# Patient Record
Sex: Male | Born: 1980 | Race: White | Hispanic: No | Marital: Married | Smoking: Current every day smoker
Health system: Southern US, Community
[De-identification: ages and names within clinical notes are randomized; demographics above are authoritative.]

## PROBLEM LIST (undated history)

## (undated) DIAGNOSIS — Z5900 Homelessness unspecified: Secondary | ICD-10-CM

## (undated) DIAGNOSIS — F319 Bipolar disorder, unspecified: Secondary | ICD-10-CM

## (undated) DIAGNOSIS — F419 Anxiety disorder, unspecified: Secondary | ICD-10-CM

---

## 2008-07-12 ENCOUNTER — Emergency Department (HOSPITAL_COMMUNITY): Admission: EM | Admit: 2008-07-12 | Discharge: 2008-07-12 | Payer: Self-pay | Admitting: Family Medicine

## 2008-07-25 ENCOUNTER — Ambulatory Visit: Payer: Self-pay | Admitting: *Deleted

## 2008-07-25 DIAGNOSIS — J029 Acute pharyngitis, unspecified: Secondary | ICD-10-CM | POA: Insufficient documentation

## 2008-07-25 DIAGNOSIS — J069 Acute upper respiratory infection, unspecified: Secondary | ICD-10-CM | POA: Insufficient documentation

## 2008-07-26 ENCOUNTER — Ambulatory Visit: Payer: Self-pay | Admitting: *Deleted

## 2008-07-27 ENCOUNTER — Encounter (INDEPENDENT_AMBULATORY_CARE_PROVIDER_SITE_OTHER): Payer: Self-pay | Admitting: *Deleted

## 2008-07-31 ENCOUNTER — Telehealth (INDEPENDENT_AMBULATORY_CARE_PROVIDER_SITE_OTHER): Payer: Self-pay | Admitting: *Deleted

## 2008-09-14 ENCOUNTER — Ambulatory Visit: Payer: Self-pay | Admitting: Psychiatry

## 2008-09-14 ENCOUNTER — Emergency Department (HOSPITAL_BASED_OUTPATIENT_CLINIC_OR_DEPARTMENT_OTHER): Admission: EM | Admit: 2008-09-14 | Discharge: 2008-09-14 | Payer: Self-pay | Admitting: Emergency Medicine

## 2008-09-14 ENCOUNTER — Inpatient Hospital Stay (HOSPITAL_COMMUNITY): Admission: AD | Admit: 2008-09-14 | Discharge: 2008-09-29 | Payer: Self-pay | Admitting: Psychiatry

## 2008-10-17 ENCOUNTER — Emergency Department (HOSPITAL_BASED_OUTPATIENT_CLINIC_OR_DEPARTMENT_OTHER): Admission: EM | Admit: 2008-10-17 | Discharge: 2008-10-17 | Payer: Self-pay | Admitting: Emergency Medicine

## 2008-12-04 ENCOUNTER — Ambulatory Visit (HOSPITAL_COMMUNITY): Payer: Self-pay | Admitting: Psychiatry

## 2008-12-14 ENCOUNTER — Ambulatory Visit (HOSPITAL_COMMUNITY): Payer: Self-pay | Admitting: Psychiatry

## 2009-01-05 ENCOUNTER — Other Ambulatory Visit: Payer: Self-pay | Admitting: Emergency Medicine

## 2009-01-05 ENCOUNTER — Emergency Department (HOSPITAL_COMMUNITY): Admission: EM | Admit: 2009-01-05 | Discharge: 2009-01-05 | Payer: Self-pay | Admitting: Emergency Medicine

## 2009-01-09 ENCOUNTER — Emergency Department (HOSPITAL_BASED_OUTPATIENT_CLINIC_OR_DEPARTMENT_OTHER): Admission: EM | Admit: 2009-01-09 | Discharge: 2009-01-09 | Payer: Self-pay | Admitting: Emergency Medicine

## 2009-04-25 ENCOUNTER — Emergency Department (HOSPITAL_BASED_OUTPATIENT_CLINIC_OR_DEPARTMENT_OTHER): Admission: EM | Admit: 2009-04-25 | Discharge: 2009-04-25 | Payer: Self-pay | Admitting: Emergency Medicine

## 2009-04-27 ENCOUNTER — Emergency Department (HOSPITAL_BASED_OUTPATIENT_CLINIC_OR_DEPARTMENT_OTHER): Admission: EM | Admit: 2009-04-27 | Discharge: 2009-04-27 | Payer: Self-pay | Admitting: Emergency Medicine

## 2009-05-05 ENCOUNTER — Emergency Department (HOSPITAL_BASED_OUTPATIENT_CLINIC_OR_DEPARTMENT_OTHER): Admission: EM | Admit: 2009-05-05 | Discharge: 2009-05-05 | Payer: Self-pay | Admitting: Emergency Medicine

## 2009-12-24 ENCOUNTER — Emergency Department (HOSPITAL_BASED_OUTPATIENT_CLINIC_OR_DEPARTMENT_OTHER): Admission: EM | Admit: 2009-12-24 | Discharge: 2009-12-24 | Payer: Self-pay | Admitting: Emergency Medicine

## 2009-12-24 ENCOUNTER — Ambulatory Visit: Payer: Self-pay | Admitting: Diagnostic Radiology

## 2010-08-08 LAB — POCT TOXICOLOGY PANEL

## 2010-08-08 LAB — COMPREHENSIVE METABOLIC PANEL
Albumin: 4 g/dL (ref 3.5–5.2)
Chloride: 102 mEq/L (ref 96–112)
GFR calc Af Amer: >60 mL/min (ref >60)
GFR calc non Af Amer: >60 mL/min (ref >60)
Potassium: 4.6 mEq/L (ref 3.5–5.1)
Sodium: 139 mEq/L (ref 135–145)

## 2010-08-08 LAB — DIFFERENTIAL
Basophils Absolute: 0.1 10*3/uL (ref 0.0–0.1)
Basophils Relative: 1 % (ref 0–1)
Eosinophils Absolute: 0 10*3/uL (ref 0.0–0.7)
Eosinophils Relative: 1 % (ref 0–5)
Monocytes Relative: 11 % (ref 3–12)
Neutro Abs: 5.2 10*3/uL (ref 1.7–7.7)
Neutrophils Relative %: 56 % (ref 43–77)

## 2010-08-08 LAB — ETHANOL: Alcohol, Ethyl (B): <5 mg/dL (ref 0–10)

## 2010-08-08 LAB — CBC: MCV: 82.5 fL (ref 78.0–100.0)

## 2010-08-12 LAB — BASIC METABOLIC PANEL
Chloride: 100 mEq/L (ref 96–112)
Creatinine, Ser: 0.8 mg/dL (ref 0.4–1.5)
GFR calc non Af Amer: >60 mL/min (ref >60)
Glucose, Bld: 105 mg/dL — ABNORMAL HIGH (ref 70–99)
Potassium: 3.8 mEq/L (ref 3.5–5.1)
Sodium: 138 mEq/L (ref 135–145)

## 2010-08-12 LAB — VALPROIC ACID LEVEL
Valproic Acid Lvl: 46 ug/mL — ABNORMAL LOW (ref 50.0–100.0)
Valproic Acid Lvl: 79.3 ug/mL (ref 50.0–100.0)
Valproic Acid Lvl: 38.7 ug/mL — ABNORMAL LOW (ref 50.0–100.0)

## 2010-08-12 LAB — CBC
Hemoglobin: 13.7 g/dL (ref 13.0–17.0)
MCV: 79.5 fL (ref 78.0–100.0)
RBC: 5.18 MIL/uL (ref 4.22–5.81)

## 2010-08-12 LAB — DIFFERENTIAL
Basophils Relative: 1 % (ref 0–1)
Monocytes Relative: 6 % (ref 3–12)

## 2010-08-12 LAB — POCT TOXICOLOGY PANEL

## 2010-09-16 NOTE — H&P (Signed)
NAMEBROWN, DUNLAP              ACCOUNT NO.:  [id number]   MEDICAL RECORD NO.:  [id number]          PATIENT TYPE:  IPS   LOCATION:  0406                          FACILITY:  [facility]   PHYSICIAN:  Anselm Jungling, MD  DATE OF BIRTH:  Dec 09, 1980   DATE OF ADMISSION:  09/14/2008  DATE OF DISCHARGE:                       PSYCHIATRIC ADMISSION ASSESSMENT   This is an involuntary admission to the services of Dr.  Geralyn Flash.   This is a 30 year old married white male.  The commitment papers  indicate that he is manic, he is not sleeping.  He has been spending  excessive money.  His wife had to lock him out of the house.  He was  found [place] [address] when he was brought to the emergency room.  The patient volunteered only minimal information.  The wife indicated  that he had been increasingly agitated over the past several weeks.  His  grandfather died 2 weeks ago.  He became lost and spent excessive money  on the way home from the funeral, which was in [place], [place].  He  has not been sleeping.  He has been coming and going at odd hours.  She  feels that he is not taking his medications.  He had been out of the  house, and she had not seen him the evening before.  She thinks he  enrolled in college yesterday, which he confirmed, and she found him  walking on [address] and brought him to the hospital.   PAST PSYCHIATRIC HISTORY:  He reports being diagnosed about 6 years ago  as bipolar in [place].  He has only been here in [place] for 6  months and has yet to establish psychiatric care.  He reports that he  received $1008 monthly for his psychiatric disability and that his  daughter and wife each receive $252 each.  He states that he is thinking  about divorcing, as his wife has had many affairs.   FAMILY HISTORY:  There is no other report that any other family member  has bipolar.   ALCOHOL/DRUG HISTORY:  He is not known to be a substance abuser, and he  denies using.   MEDICAL PROBLEMS:  He is a borderline diabetic due to his being  overweight.   MEDICATIONS:  He is supposed to be on Depakote ER 2000 mg p.o. daily and  Invega 6 mg in the morning.  He states that he does not take this.   DRUG ALLERGIES:  No known drug allergies.   POSITIVE PHYSICAL FINDINGS:  He was medically cleared in the ED.  He had  no remarkable physical findings other than a slightly elevated WBC at  14,000.  His glucose was slightly high at 105.  His UDS was completely  negative.  He had no alcohol.  His valproic acid level was low at 38.7.  VITAL SIGNS:  Temperature 98.3, blood pressure 125/66 to 136/99, pulse  110 to 116, respirations 14 to 22.   He did require chemical restraint while in the emergency department of  [facility] and [facility].  This was due to him  feeling that he was being  unjustly committed.  He was rambling.  He was disjointed.  He was  perseverating on details.  He states that his wife told him that the  marriage is done, that she hates him, that he should burn in hell.  He  believes that she only wants his disability check.  He was quite focused  on looking for a lawyer in the phone book to represent him, but he was  easily distracted.   MENTAL STATUS EXAM:  Today, he was seen along with Dr. Katrinka Blazing.  He is  alert and oriented.  He was appropriately groomed, dressed, and  nourished.  His speech was intact, but it was quite superficial.  His  mood was calm.  His affect was flat.  Thought processes were clear,  rational, and goal-oriented.  He was requesting discharge.  Judgment and  insight are poor.  Concentration and memory are poor.  Intelligence is  at average.  He denied being suicidal or homicidal.  He denied having  any auditory or visual hallucinations.   DIAGNOSES:   AXIS I:  Bipolar.   AXIS II:  Deferred.   AXIS III:  No known illnesses but he is obese.   AXIS IV:  Noncompliance with medications.  It is unclear whether he is   getting a divorce or whether he can return home to his wife.   AXIS V:  35.   PLAN:  Admit for safety and stabilization, to reestablish compliance  with medications.  Towards that end, he was given Depakote ER 1000 mg  last night.  He was prescribed Invega 6 mg in the morning, which he  declined.  He was allowed to have Ativan 2 mg p.o. or IM q.6h. p.r.n.  agitation.  He was also allowed to have Ambien 10 mg q.h.s.  Estimated  length of stay is 3 to 5 days.      Mickie Leonarda Salon, P.A.-C.      Anselm Jungling, MD  Electronically Signed    MD/MEDQ  D:  09/15/2008  T:  09/15/2008  Job:  [id number]

## 2010-09-19 NOTE — Discharge Summary (Signed)
Timothy Prince, Timothy Prince              ACCOUNT NO.:  [id number]   MEDICAL RECORD NO.:  [id number]          PATIENT TYPE:  IPS   LOCATION:  0400                          FACILITY:  [facility]   PHYSICIAN:  Anselm Jungling, MD  DATE OF BIRTH:  [birth year]   DATE OF ADMISSION:  09/14/2008  DATE OF DISCHARGE:  09/29/2008                               DISCHARGE SUMMARY   IDENTIFYING INFORMATION/JUSTIFICATION FOR ADMISSION AND CARE:  This was  an inpatient psychiatric admission for Timothy Prince, a 30 year old Caucasian  married male who was admitted due to exacerbation of preexisting bipolar  disorder.  Please refer to the admission note for further details  pertaining to the symptoms, circumstances and history that led to his  hospitalization.  He was given an initial Axis I diagnosis of bipolar  disorder NOS.   MEDICAL AND LABORATORY:  The patient was in good health without any  active or chronic medical problems.  He was medically and physically  assessed by the psychiatric nurse practitioner.  There were no  significant medical issues.   HOSPITAL COURSE:  The patient was admitted to the adult inpatient  psychiatric service.  He presented as a well-nourished, normally-  developed male who appeared to be in a hypomanic state, with  disorganized thinking.  He appeared to have limited insight.  He did not  see a basis for his hospitalization.  He tended to externalize reasons  for his admission, indicating that it had to do with his wife's  problems.   We were later able to learn through his wife that the patient had a  history of severe mental disorder that had previously been treated in  other states, before they moved to the [facility] area.  Apparently,  in 2009, the patient had a severe illness that involved several months  of treatment before it was finally stabilized.  The wife stated that in  the past, Tahjay would become stabilized on medication, would do well,  but then would go off  medication, and become seriously ill again.   The patient was treated with a combination of Abilify, Haldol, and  Depakote.  Over a 2-week period, he stabilized very gradually.  As he  did so, his hypomanic behaviors and thinking gradually diminished, he  became more realistic, and showed more in the way of accurate insight  into his condition.   His wife was available for family conferencing, and was quite supportive  throughout his stay.  Although it was not clear whether this couple  would decide to separate, at the beginning of his stay, by the end of  his hospital stay, he showed enough good compliance with medication  treatment and good enough response to medication treatment that his wife  was encouraged and wanted him to be able to come home.   There was a family session that occurred on the day of discharge, over  the phone.  The patient was able to acknowledge in this telephone  conference that he was much calmer, and that he had been out of control  when he first came into the hospital.  He verbalized that he needed to  stay on his medications and that he actually does feel better when he  was taking.  Wife agreed that the patient had made significant progress.  Discharge and aftercare planning was discussed at length.  The patient  agreed to following aftercare plan.   AFTERCARE:  The patient was to follow up at [facility], with an intake appointment on June 2nd at 11 a.m.   DISCHARGE MEDICATIONS:  1. Abilify 20 mg nightly.  2. Haldol 10 mg b.i.d.  3. Depakote 1000 mg b.i.d.  4. Ambien 10 mg nightly.  5. Ativan 2 mg 1 p.o. p.r.n. severe agitation.   DISCHARGE DIAGNOSES:  Axis I:  Schizoaffective disorder, most recently  manic with psychotic features, resolving.  Axis II:  Deferred.  Axis III:  No acute or chronic illnesses.  Axis IV:  Stressors severe.  Axis V:  Global Assessment of Functioning on discharge 55.      Anselm Jungling, MD   Electronically Signed     SPB/MEDQ  D:  10/01/2008  T:  10/01/2008  Job:  [id number]

## 2011-01-16 DIAGNOSIS — F319 Bipolar disorder, unspecified: Secondary | ICD-10-CM | POA: Insufficient documentation

## 2011-01-16 DIAGNOSIS — X500XXA Overexertion from strenuous movement or load, initial encounter: Secondary | ICD-10-CM | POA: Insufficient documentation

## 2011-01-16 DIAGNOSIS — Y92009 Unspecified place in unspecified non-institutional (private) residence as the place of occurrence of the external cause: Secondary | ICD-10-CM | POA: Insufficient documentation

## 2011-01-16 DIAGNOSIS — F411 Generalized anxiety disorder: Secondary | ICD-10-CM | POA: Insufficient documentation

## 2011-01-16 DIAGNOSIS — IMO0002 Reserved for concepts with insufficient information to code with codable children: Secondary | ICD-10-CM | POA: Insufficient documentation

## 2011-01-17 ENCOUNTER — Other Ambulatory Visit: Payer: Self-pay

## 2011-01-17 ENCOUNTER — Emergency Department (HOSPITAL_BASED_OUTPATIENT_CLINIC_OR_DEPARTMENT_OTHER)
Admission: EM | Admit: 2011-01-17 | Discharge: 2011-01-17 | Disposition: A | Discharge status: Home / Self Care | Payer: Medicare Other | Attending: Emergency Medicine | Admitting: Emergency Medicine

## 2011-01-17 ENCOUNTER — Emergency Department (INDEPENDENT_AMBULATORY_CARE_PROVIDER_SITE_OTHER): Payer: Medicare Other

## 2011-01-17 DIAGNOSIS — S29011A Strain of muscle and tendon of front wall of thorax, initial encounter: Secondary | ICD-10-CM

## 2011-01-17 DIAGNOSIS — R0789 Other chest pain: Secondary | ICD-10-CM

## 2011-01-17 HISTORY — DX: Bipolar disorder, unspecified: F31.9

## 2011-01-17 HISTORY — DX: Anxiety disorder, unspecified: F41.9

## 2011-01-17 LAB — CBC
HCT: 40.8 % (ref 39.0–52.0)
MCH: 26.9 pg (ref 26.0–34.0)
MCV: 80.2 fL (ref 78.0–100.0)
Platelets: 256 10*3/uL (ref 150–400)
RBC: 5.09 MIL/uL (ref 4.22–5.81)
RDW: 13.2 % (ref 11.5–15.5)

## 2011-01-17 LAB — BASIC METABOLIC PANEL
CO2: 25 mEq/L (ref 19–32)
Calcium: 9.1 mg/dL (ref 8.4–10.5)
Creatinine, Ser: 0.5 mg/dL (ref 0.50–1.35)
GFR calc Af Amer: >60 mL/min (ref >60)
GFR calc non Af Amer: >60 mL/min (ref >60)
Glucose, Bld: 108 mg/dL — ABNORMAL HIGH (ref 70–99)
Potassium: 4.2 mEq/L (ref 3.5–5.1)

## 2011-01-17 LAB — CARDIAC PANEL(CRET KIN+CKTOT+MB+TROPI)
CK, MB: 1.5 ng/mL (ref 0.3–4.0)
Relative Index: 1.5 (ref 0.0–2.5)

## 2011-01-17 LAB — D-DIMER, QUANTITATIVE: D-Dimer, Quant: <0.22 ug/mL-FEU (ref 0.00–0.48)

## 2011-01-17 MED ORDER — ASPIRIN 325 MG PO TABS
325.0000 mg | ORAL_TABLET | ORAL | Status: AC
  Administered 2011-01-17: 325 mg via ORAL
  Filled 2011-01-17: qty 1

## 2011-01-17 MED ORDER — IBUPROFEN 800 MG PO TABS: 800.0000 mg | ORAL_TABLET | Freq: Three times a day (TID) | ORAL | Status: AC

## 2011-01-17 NOTE — ED Provider Notes (Signed)
History     CSN: [id number] Arrival date & time: 01/17/2011 12:17 AM   Chief Complaint  Patient presents with  . Chest Pain     (Include location/radiation/quality/duration/timing/severity/associated sxs/prior treatment) Patient is a 30 y.o. male presenting with chest pain.  Chest Pain The chest pain began 1 - 2 hours ago. Duration of episode(s) is 10 minutes. Chest pain occurs constantly. The chest pain is improving. The pain is associated with breathing. At its most intense, the pain is at 8/10. The pain is currently at 0/10. The quality of the pain is described as sharp. The pain does not radiate. Chest pain is worsened by deep breathing. Pertinent negatives for primary symptoms include no fever, no syncope, no shortness of breath, no cough, no wheezing, no abdominal pain, no nausea and no vomiting.  Pertinent negatives for associated symptoms include no diaphoresis, no lower extremity edema and no weakness. He tried nothing for the symptoms. Risk factors include male gender.  Pertinent negatives for past medical history include no aortic dissection, no CAD, no cancer and no PE.  Pertinent negatives for family medical history include: no CAD in family and no Marfan's syndrome in family.  Procedure history is negative for exercise treadmill test.   at home, was carrying a heavy basket with onset of L sided sharp pains, seemed to be worse with a deep breath, now almost gone. No SOB or other associated symptoms   Past Medical History  Diagnosis Date  . Anxiety   . Bipolar disorder      History reviewed. No pertinent past surgical history.  History reviewed. No pertinent family history.  History  Substance Use Topics  . Smoking status: Never Smoker   . Smokeless tobacco: Never Used  . Alcohol Use: No      Review of Systems  Constitutional: Negative for fever, chills and diaphoresis.  HENT: Negative for neck pain and neck stiffness.   Eyes: Negative for pain.    Respiratory: Negative for cough, shortness of breath and wheezing.   Cardiovascular: Positive for chest pain. Negative for syncope.  Gastrointestinal: Negative for nausea, vomiting and abdominal pain.  Genitourinary: Negative for dysuria.  Musculoskeletal: Negative for back pain.  Skin: Negative for rash.  Neurological: Negative for weakness and headaches.  All other systems reviewed and are negative.    Allergies  Review of patient's allergies indicates no known allergies.  Home Medications   Current Outpatient Rx  Name Route Sig Dispense Refill  . TEGRETOL PO Oral Take 400 mg by mouth at bedtime.      . ILOPERIDONE 4 MG PO TABS Oral Take 6 mg by mouth at bedtime.        Physical Exam    BP 119/84  Pulse 75  Temp(Src) 99.5 F (37.5 C) (Oral)  Resp 16  Ht 6\' 1"  (1.854 m)  Wt 280 lb (127.007 kg)  BMI 36.94 kg/m2  SpO2 99%  Physical Exam  Constitutional: He is oriented to person, place, and time. He appears well-developed and well-nourished.  HENT:  Head: Normocephalic and atraumatic.  Eyes: Conjunctivae and EOM are normal. Pupils are equal, round, and reactive to light.  Neck: Trachea normal. Neck supple. No thyromegaly present.  Cardiovascular: Normal rate, regular rhythm, S1 normal, S2 normal and normal pulses.     No systolic murmur is present   No diastolic murmur is present  Pulses:      Radial pulses are 2+ on the right side, and 2+ on the left side.  Pulmonary/Chest: Effort normal and breath sounds normal. He has no wheezes. He has no rhonchi. He has no rales. He exhibits no tenderness.  Abdominal: Soft. Normal appearance and bowel sounds are normal. There is no tenderness. There is no CVA tenderness and negative Murphy's sign.  Musculoskeletal:       BLE:s Calves nontender, no cords or erythema, negative Homans sign  Neurological: He is alert and oriented to person, place, and time. He has normal strength. No cranial nerve deficit or sensory deficit. GCS eye  subscore is 4. GCS verbal subscore is 5. GCS motor subscore is 6.  Skin: Skin is warm and dry. No rash noted. He is not diaphoretic.  Psychiatric: His speech is normal.       Cooperative and appropriate    ED Course  Procedures  Results for orders placed during the hospital encounter of 01/17/11  CBC      Component Value Range   WBC 8.7  4.0 - 10.5 (K/uL)   RBC 5.09  4.22 - 5.81 (MIL/uL)   Hemoglobin 13.7  13.0 - 17.0 (g/dL)   HCT 40.9  81.1 - 91.4 (%)   MCV 80.2  78.0 - 100.0 (fL)   MCH 26.9  26.0 - 34.0 (pg)   MCHC 33.6  30.0 - 36.0 (g/dL)   RDW 78.2  95.6 - 21.3 (%)   Platelets 256  150 - 400 (K/uL)  BASIC METABOLIC PANEL      Component Value Range   Sodium 137  135 - 145 (mEq/L)   Potassium 4.2  3.5 - 5.1 (mEq/L)   Chloride 102  96 - 112 (mEq/L)   CO2 25  19 - 32 (mEq/L)   Glucose, Bld 108 (*) 70 - 99 (mg/dL)   BUN 17  6 - 23 (mg/dL)   Creatinine, Ser 0.86  0.50 - 1.35 (mg/dL)   Calcium 9.1  8.4 - 57.8 (mg/dL)   GFR calc non Af Amer >60  >60 (mL/min)   GFR calc Af Amer >60  >60 (mL/min)  CARDIAC PANEL(CRET KIN+CKTOT+MB+TROPI)      Component Value Range   Total CK 102  7 - 232 (U/L)   CK, MB 1.5  0.3 - 4.0 (ng/mL)   Troponin I <0.30  <0.30 (ng/mL)   Relative Index 1.5  0.0 - 2.5   D-DIMER, QUANTITATIVE      Component Value Range   D-Dimer, Quant <0.22  0.00 - 0.48 (ug/mL-FEU)   Dg Chest 2 View  01/17/2011  *RADIOLOGY REPORT*  Clinical Data: Mid chest pain.  CHEST - 2 VIEW  Comparison: Chest radiograph performed 12/24/2009  Findings: The lungs are well-aerated and clear.  There is no evidence of focal opacification, pleural effusion or pneumothorax. Symmetric density at the lung bases likely reflects overlying soft tissues.  The heart is normal in size; the mediastinal contour is within normal limits.  No acute osseous abnormalities are seen.  IMPRESSION: No acute cardiopulmonary process seen.  Original Report Authenticated By: Tonia Ghent, M.D.    Date: 01/17/2011   Rate: 81  Rhythm: normal sinus rhythm  QRS Axis: normal  Intervals: normal  ST/T Wave abnormalities: nonspecific ST changes  Conduction Disutrbances:none  Narrative Interpretation:   Old EKG Reviewed: unchanged    DX Chest wall pain R/O PE    MDM  CP after heavy lifting, sharp in nature with no risk factors for ACS. Screening ECg WNL. D-dimer sent for low pretest prob PE (pain with deep inspiration and tenderness on exam) was  WNL. No pain in ED. PT stable for discharge home, states understanding strict return precautions and follow up instructions.        Sunnie Nielsen, MD 01/17/11 [phone]

## 2011-01-17 NOTE — ED Notes (Signed)
Pt states that he thinks that pain may be musculoskeletal related because he remembered that he was lifting some heavy objects last evening about 1400.  Chest pain started about 2000.

## 2011-01-17 NOTE — ED Notes (Signed)
Pt states that he has onset of chest pain about two hours ago, occasional sob, no nausea, vomiting, diaphoresis.  Pt states that he has experienced chest pain in the past with anxiety attacks.  Pt states that he does not have any other risk factors for MI.

## 2015-11-18 ENCOUNTER — Emergency Department (HOSPITAL_BASED_OUTPATIENT_CLINIC_OR_DEPARTMENT_OTHER)
Admission: EM | Admit: 2015-11-18 | Discharge: 2015-11-18 | Disposition: A | Discharge status: Home / Self Care | Payer: Medicare Other | Attending: Emergency Medicine | Admitting: Emergency Medicine

## 2015-11-18 ENCOUNTER — Encounter (HOSPITAL_BASED_OUTPATIENT_CLINIC_OR_DEPARTMENT_OTHER): Payer: Self-pay | Admitting: *Deleted

## 2015-11-18 ENCOUNTER — Emergency Department (HOSPITAL_BASED_OUTPATIENT_CLINIC_OR_DEPARTMENT_OTHER): Payer: Medicare Other

## 2015-11-18 DIAGNOSIS — F319 Bipolar disorder, unspecified: Secondary | ICD-10-CM | POA: Diagnosis not present

## 2015-11-18 DIAGNOSIS — S6992XA Unspecified injury of left wrist, hand and finger(s), initial encounter: Secondary | ICD-10-CM | POA: Insufficient documentation

## 2015-11-18 DIAGNOSIS — Y999 Unspecified external cause status: Secondary | ICD-10-CM | POA: Diagnosis not present

## 2015-11-18 DIAGNOSIS — Y939 Activity, unspecified: Secondary | ICD-10-CM | POA: Insufficient documentation

## 2015-11-18 DIAGNOSIS — Y929 Unspecified place or not applicable: Secondary | ICD-10-CM | POA: Diagnosis not present

## 2015-11-18 DIAGNOSIS — W228XXA Striking against or struck by other objects, initial encounter: Secondary | ICD-10-CM | POA: Insufficient documentation

## 2015-11-18 NOTE — ED Notes (Signed)
Bent his left wrist backward and felt a snap.

## 2015-11-18 NOTE — Discharge Instructions (Signed)
Wrist Sprain °A wrist sprain is a stretch or tear in the strong, fibrous tissues (ligaments) that connect your wrist bones. The ligaments of your wrist may be easily sprained. There are three types of wrist sprains. °· Grade 1. The ligament is not stretched or torn, but the sprain causes pain. °· Grade 2. The ligament is stretched or partially torn. You may be able to move your wrist, but not very much. °· Grade 3. The ligament or muscle completely tears. You may find it difficult or extremely painful to move your wrist even a little. °CAUSES °Often, wrist sprains are a result of a fall or an injury. The force of the impact causes the fibers of your ligament to stretch too much or tear. Common causes of wrist sprains include: °· Overextending your wrist while catching a ball with your hands. °· Repetitive or strenuous extension or bending of your wrist. °· Landing on your hand during a fall. °RISK FACTORS °· Having previous wrist injuries. °· Playing contact sports, such as boxing or wrestling. °· Participating in activities in which falling is common. °· Having poor wrist strength and flexibility. °SIGNS AND SYMPTOMS °· Wrist pain. °· Wrist tenderness. °· Inflammation or bruising of the wrist area. °· Hearing a "pop" or feeling a tear at the time of the injury. °· Decreased wrist movement due to pain, stiffness, or weakness. °DIAGNOSIS °Your health care provider will examine your wrist. In some cases, an X-ray will be taken to make sure you did not break any bones. If your health care provider thinks that you tore a ligament, he or she may order an MRI of your wrist. °TREATMENT °Treatment involves resting and icing your wrist. You may also need to take pain medicines to help lessen pain and inflammation. Your health care provider may recommend keeping your wrist still (immobilized) with a splint to help your sprain heal. When the splint is no longer necessary, you may need to perform strengthening and stretching  exercises. These exercises help you to regain strength and full range of motion in your wrist. Surgery is not usually needed for wrist sprains unless the ligament completely tears. °HOME CARE INSTRUCTIONS °· Rest your wrist. Do not do things that cause pain. °· Wear your wrist splint as directed by your health care provider. °· Take medicines only as directed by your health care provider. °· To ease pain and swelling, apply ice to the injured area. °¨ Put ice in a plastic bag. °¨ Place a towel between your skin and the bag. °¨ Leave the ice on for 20 minutes, 2-3 times a day. °SEEK MEDICAL CARE IF: °· Your pain, discomfort, or swelling gets worse even with treatment. °· You feel sudden numbness in your hand. °  °This information is not intended to replace advice given to you by your health care provider. Make sure you discuss any questions you have with your health care provider. °  °Document Released: 12/22/2013 Document Reviewed: 12/22/2013 °Elsevier Interactive Patient Education ©2016 [employer] ° °

## 2015-11-18 NOTE — ED Provider Notes (Signed)
CSN: [id number]     Arrival date & time 11/18/15  1141 History   First MD Initiated Contact with Patient 11/18/15 1310     Chief Complaint  Patient presents with  . Wrist Injury     (Consider location/radiation/quality/duration/timing/severity/associated sxs/prior Treatment) Patient is a 35 y.o. male presenting with wrist injury. The history is provided by the patient.  Wrist Injury Location:  Wrist Time since incident:  1 hour Injury: yes   Mechanism of injury comment:  HIT WRIST AGAINST LOW CEILING. HEARD/FELT A POP, HAD TINGLING WHICH RESOLVED AFTER A FEW SECONDS Wrist location:  R wrist and L wrist Pain details:    Quality:  Aching   Radiates to:  Does not radiate   Severity:  Mild   Onset quality:  Sudden   Progression:  Improving Chronicity:  New Handedness:  Right-handed Dislocation: no   Foreign body present:  No foreign bodies Prior injury to area:  No Relieved by:  Rest Worsened by:  Movement Ineffective treatments:  None tried Associated symptoms: tingling   Associated symptoms: no back pain, no decreased range of motion, no fatigue, no fever, no muscle weakness, no neck pain, no numbness, no stiffness and no swelling     Past Medical History  Diagnosis Date  . Anxiety   . Bipolar disorder (HCC)    History reviewed. No pertinent past surgical history. No family history on file. Social History  Substance Use Topics  . Smoking status: Never Smoker   . Smokeless tobacco: Never Used  . Alcohol Use: No    Review of Systems  Constitutional: Negative for fever and fatigue.  Musculoskeletal: Negative for back pain, joint swelling, stiffness and neck pain.  Skin: Negative for rash and wound.      Allergies  Review of patient's allergies indicates no known allergies.  Home Medications   Prior to Admission medications   Medication Sig Start Date End Date Taking? Authorizing Provider  CarBAMazepine (TEGRETOL PO) Take 400 mg by mouth at bedtime.       Historical Provider, MD  iloperidone (FANAPT) 4 MG TABS Take 6 mg by mouth at bedtime.      Historical Provider, MD   BP 126/83 mmHg  Pulse 85  Temp(Src) 98.8 F (37.1 C) (Oral)  Resp 20  Ht  (1.88 m)  Wt 106.142 kg  BMI 30.03 kg/m2  SpO2 99% Physical Exam  Constitutional: He is oriented to person, place, and time. He appears well-developed and well-nourished. No distress.  HENT:  Head: Normocephalic and atraumatic.  Eyes: Conjunctivae are normal. No scleral icterus.  Neck: Normal range of motion. Neck supple.  Cardiovascular: Normal rate, regular rhythm and normal heart sounds.   Pulmonary/Chest: Effort normal and breath sounds normal. No respiratory distress.  Abdominal: Soft. There is no tenderness.  Musculoskeletal: He exhibits no edema.  A left wrist exam was performed. SKIN: intact SWELLING: none WARMTH: no warmth ROM: normal TENDERNESS: mild. OVER THE MUSCULATURE OF THE ULNAR SIDE STRENGTH: normal STABILITY: normal NEUROVASCULAR EXAM normal CAST/SPLINT: VELCRO VOLAR   Neurological: He is alert and oriented to person, place, and time.  Skin: Skin is warm and dry. He is not diaphoretic.  Psychiatric: His behavior is normal.  Nursing note and vitals reviewed.   ED Course  Procedures (including critical care time) Labs Review Labs Reviewed - No data to display  Imaging Review Dg Wrist Complete Left  11/18/2015  CLINICAL DATA:  Left wrist pain/ numbness/tingling EXAM: LEFT WRIST - COMPLETE 3+ VIEW  COMPARISON:  None. FINDINGS: No fracture or dislocation is seen. The joint spaces are preserved. Visualized soft tissues are within normal limits. IMPRESSION: No fracture or dislocation is seen. Electronically Signed   By: Charline BillsSriyesh  Krishnan M.D.   On: 11/18/2015 12:18   I have personally reviewed and evaluated these images and lab results as part of my medical decision-making.   EKG Interpretation None      MDM   Final diagnoses:  Wrist injury, left, initial  encounter    Patient X-Ray negative for obvious fracture or dislocation. Pain managed in ED. Pt advised to follow up with orthopedics if symptoms persist for possibility of missed fracture diagnosis. Patient given brace while in ED, conservative therapy recommended and discussed. Patient will be dc home & is agreeable with above plan.     Arthor Captainbigail Mitchelle Goerner, PA-C 11/18/15 1344   Leta BaptistEmily Roe Nguyen, MD 11/25/15 0100

## 2016-04-21 ENCOUNTER — Emergency Department (HOSPITAL_COMMUNITY)
Admission: EM | Admit: 2016-04-21 | Discharge: 2016-04-21 | Disposition: A | Discharge status: Home / Self Care | Payer: Medicare Other | Source: Home / Self Care | Attending: Emergency Medicine | Admitting: Emergency Medicine

## 2016-04-21 ENCOUNTER — Encounter (HOSPITAL_COMMUNITY): Payer: Self-pay | Admitting: Emergency Medicine

## 2016-04-21 DIAGNOSIS — S90821A Blister (nonthermal), right foot, initial encounter: Secondary | ICD-10-CM | POA: Insufficient documentation

## 2016-04-21 DIAGNOSIS — F17210 Nicotine dependence, cigarettes, uncomplicated: Secondary | ICD-10-CM | POA: Insufficient documentation

## 2016-04-21 DIAGNOSIS — M79672 Pain in left foot: Secondary | ICD-10-CM | POA: Diagnosis not present

## 2016-04-21 DIAGNOSIS — R456 Violent behavior: Secondary | ICD-10-CM | POA: Diagnosis not present

## 2016-04-21 DIAGNOSIS — X58XXXA Exposure to other specified factors, initial encounter: Secondary | ICD-10-CM | POA: Insufficient documentation

## 2016-04-21 DIAGNOSIS — Y999 Unspecified external cause status: Secondary | ICD-10-CM

## 2016-04-21 DIAGNOSIS — Y9289 Other specified places as the place of occurrence of the external cause: Secondary | ICD-10-CM

## 2016-04-21 DIAGNOSIS — Y9301 Activity, walking, marching and hiking: Secondary | ICD-10-CM

## 2016-04-21 DIAGNOSIS — M79671 Pain in right foot: Secondary | ICD-10-CM | POA: Diagnosis present

## 2016-04-21 DIAGNOSIS — S90822A Blister (nonthermal), left foot, initial encounter: Secondary | ICD-10-CM | POA: Insufficient documentation

## 2016-04-21 DIAGNOSIS — S90829A Blister (nonthermal), unspecified foot, initial encounter: Secondary | ICD-10-CM

## 2016-04-21 NOTE — ED Notes (Signed)
RN provided hygiene supplied and allowed patient to shower; also performed wound care to bilat feet and gave few extra supplies; pt was discharged to the lobby with [place] sandwich and coke;

## 2016-04-21 NOTE — ED Notes (Signed)
Pt stating he is homeless and hasnt slept or eaten in days; pt drowsy during NP and RN assessment/interview

## 2016-04-21 NOTE — ED Notes (Signed)
Warm tap water and betadine soak applied to bilat feet at this time

## 2016-04-21 NOTE — ED Provider Notes (Signed)
[facility] DEPT Provider Note   CSN: [id number] Arrival date & time: 04/21/16  [id number]     History   Chief Complaint Chief Complaint  Patient presents with  . Foot Pain    HPI Timothy SeveranceJoshua Prince is a 35 y.o. male.  Patient presents to the ED for evaluation of multiple unroofed blisters to the dorsal aspect of both feet.  Patient is homeless and spends a lot of time walking.  Patient also states that he is getting little sleep due to not having a secure place to stay. He reports that he is not able to stay at the local shelters and has no friends or family in the area. [facility] in [place] 04/16/16 for the same complaint.   The history is provided by the patient. No language interpreter was used.  Foot Pain  The current episode started more than 1 week ago. The problem occurs constantly. The problem has been gradually worsening. The symptoms are aggravated by walking. He has tried rest for the symptoms. The treatment provided no relief.    Past Medical History:  Diagnosis Date  . Anxiety   . Bipolar disorder [facility](HCC)     Patient Active Problem List   Diagnosis Date Noted  . PHARYNGITIS 07/25/2008  . VIRAL URI 07/25/2008    History reviewed. No pertinent surgical history.     Home Medications    Prior to Admission medications   Medication Sig Start Date End Date Taking? Authorizing Provider  CarBAMazepine (TEGRETOL PO) Take 400 mg by mouth at bedtime.      Historical Provider, MD  iloperidone (FANAPT) 4 MG TABS Take 6 mg by mouth at bedtime.      Historical Provider, MD    Family History History reviewed. No pertinent family history.  Social History Social History  Substance Use Topics  . Smoking status: Current Every Day Smoker    Packs/day: 1.00    Types: Cigarettes  . Smokeless tobacco: Never Used  . Alcohol use No     Allergies   Patient has no known allergies.   Review of Systems Review of Systems  Psychiatric/Behavioral:  Positive for sleep disturbance.  All other systems reviewed and are negative.    Physical Exam Updated Vital Signs BP 106/68 (BP Location: Right Arm)   Pulse 85   Temp 98.1 F (36.7 C) (Oral)   Ht 6\' 1"  (1.854 m)   Wt 106.1 kg   SpO2 100%   BMI 30.87 kg/m   Physical Exam  Constitutional: He is oriented to person, place, and time. He appears well-developed and well-nourished.  HENT:  Head: Atraumatic.  Eyes: Conjunctivae are normal.  Neck: Neck supple.  Cardiovascular: Normal rate and regular rhythm.   Pulmonary/Chest: Effort normal and breath sounds normal.  Abdominal: Soft. Bowel sounds are normal.  Musculoskeletal: Normal range of motion. He exhibits tenderness.  Neurological: He is alert and oriented to person, place, and time.  Skin: Skin is warm and dry.  Nursing note and vitals reviewed.        ED Treatments / Results  Labs (all labs ordered are listed, but only abnormal results are displayed) Labs Reviewed - No data to display  EKG  EKG Interpretation None       Radiology No results found.  Procedures Procedures (including critical care time)  Medications Ordered in ED Medications - No data to display   Initial Impression / Assessment and Plan / ED Course  I have reviewed the  triage vital signs and the nursing notes.  Pertinent labs & imaging results that were available during my care of the patient were reviewed by me and considered in my medical decision making (see chart for details).  Clinical Course   Patient with multiple friction blisters to the dorsal aspect of both feet in various stages of healing. No current signs of infection.  Wounds cleaned and dressed. Conservative therapy recommended and discussed. Patient will be discharged with care instructions provided and return precautions discussed. Pt appears safe for discharge.     Final Clinical Impressions(s) / ED Diagnoses   Final diagnoses:  Friction blisters of the soles,  unspecified laterality, initial encounter    New Prescriptions New Prescriptions   No medications on file     Felicie Mornavid Fenna Semel, NP 04/21/16 [id number]    Shon Batonourtney F Horton, MD 04/22/16 [phone]

## 2016-04-21 NOTE — ED Triage Notes (Signed)
Pt brought to ED by [facility] for 9/10 foot pain, pt is homeless, having several blisters on his bilateral foot.

## 2016-04-21 NOTE — ED Triage Notes (Addendum)
Pt. arrived with EMS from [facility] ( homeless ) reports worsening chronic bilateral feet pain for several days with blisters/swelling , seen here this morning and was discharged home .

## 2016-04-22 ENCOUNTER — Emergency Department (HOSPITAL_COMMUNITY)
Admission: EM | Admit: 2016-04-22 | Discharge: 2016-04-22 | Disposition: A | Discharge status: Home / Self Care | Payer: Medicare Other | Attending: Emergency Medicine | Admitting: Emergency Medicine

## 2016-04-22 DIAGNOSIS — M79672 Pain in left foot: Secondary | ICD-10-CM

## 2016-04-22 DIAGNOSIS — M79671 Pain in right foot: Secondary | ICD-10-CM

## 2016-04-22 DIAGNOSIS — R4689 Other symptoms and signs involving appearance and behavior: Secondary | ICD-10-CM

## 2016-04-22 HISTORY — DX: Homelessness: Z59.0

## 2016-04-22 HISTORY — DX: Homelessness unspecified: Z59.00

## 2016-04-22 LAB — CBC WITH DIFFERENTIAL/PLATELET
BASOS PCT: 1 %
Basophils Absolute: 0.1 10*3/uL (ref 0.0–0.1)
EOS ABS: 0.4 10*3/uL (ref 0.0–0.7)
EOS PCT: 4 %
HCT: 42.6 % (ref 39.0–52.0)
HEMOGLOBIN: 13.9 g/dL (ref 13.0–17.0)
LYMPHS ABS: 2.8 10*3/uL (ref 0.7–4.0)
Lymphocytes Relative: 29 %
MCH: 27.3 pg (ref 26.0–34.0)
MCHC: 32.6 g/dL (ref 30.0–36.0)
MCV: 83.5 fL (ref 78.0–100.0)
MONO ABS: 0.8 10*3/uL (ref 0.1–1.0)
MONOS PCT: 8 %
Neutro Abs: 5.7 10*3/uL (ref 1.7–7.7)
Neutrophils Relative %: 58 %
PLATELETS: 272 10*3/uL (ref 150–400)
RBC: 5.1 MIL/uL (ref 4.22–5.81)
RDW: 13.8 % (ref 11.5–15.5)
WBC: 9.7 10*3/uL (ref 4.0–10.5)

## 2016-04-22 LAB — BASIC METABOLIC PANEL
Anion gap: 8 (ref 5–15)
BUN: 12 mg/dL (ref 6–20)
CALCIUM: 9.1 mg/dL (ref 8.9–10.3)
CHLORIDE: 102 mmol/L (ref 101–111)
CO2: 29 mmol/L (ref 22–32)
CREATININE: 0.88 mg/dL (ref 0.61–1.24)
GFR calc non Af Amer: >60 mL/min (ref >60)
Glucose, Bld: 102 mg/dL — ABNORMAL HIGH (ref 65–99)
Potassium: 4.1 mmol/L (ref 3.5–5.1)
SODIUM: 139 mmol/L (ref 135–145)

## 2016-04-22 LAB — ETHANOL

## 2016-04-22 MED ORDER — BACITRACIN ZINC 500 UNIT/GM EX OINT: TOPICAL_OINTMENT | Freq: Two times a day (BID) | CUTANEOUS | Status: DC

## 2016-04-22 NOTE — ED Provider Notes (Signed)
[facility] Provider Note   CSN: [id number] Arrival date & time: 04/21/16  2324   By signing my name below, I, Arianna Nassar, attest that this documentation has been prepared under the direction and in the presence of Shon Batonourtney F Lavaughn Bisig, MD.  Electronically Signed: Octavia HeirArianna Nassar, ED Scribe. 04/22/16. 7:38 AM.   History   Chief Complaint Chief Complaint  Patient presents with  . Foot Pain    The history is provided by the patient and the EMS personnel. No language interpreter was used.   HPI Comments: Timothy SeveranceJoshua Prince is a 35 y.o. male who presents to the Emergency Department by EMS Initially with bilateral foot pain. On my initial evaluation, patient is very aggressive and cussing at me. I discussed with him that I would come back when he was more calm and respectful.   On reassessment with security in the room, patient states "I just want help." He reports bilateral foot pain and blistering. He also reports that he's been off his medications. Pt expresses passive HI stating I just feel angry and I'm in pain. No plan. Denies SI Pt was found by EMS at [facility]. He states that he has been out of his Fanapt antipsychotic medication for the past 7 days. He feels he may be withdrawing  Pt further reports chronic bilateral foot pain. He was was seen yesterday for same. He expresses that he is homeless and only takes showers every 4-5 days. Denies auditory or visual hallucinations, chest pain, abdomial pain, bladder or bowel incontinence, difficulty urinating or bowel movements, and shortness of breath.   Past Medical History:  Diagnosis Date  . Anxiety   . Bipolar disorder (HCC)   . Homelessness     Patient Active Problem List   Diagnosis Date Noted  . PHARYNGITIS 07/25/2008  . VIRAL URI 07/25/2008    History reviewed. No pertinent surgical history.     Home Medications    Prior to Admission medications   Medication Sig Start Date End Date Taking? Authorizing Provider   CarBAMazepine (TEGRETOL PO) Take 400 mg by mouth at bedtime.      Historical Provider, MD  iloperidone (FANAPT) 4 MG TABS Take 6 mg by mouth at bedtime.      Historical Provider, MD    Family History No family history on file.  Social History Social History  Substance Use Topics  . Smoking status: Current Every Day Smoker    Packs/day: 1.00    Types: Cigarettes  . Smokeless tobacco: Never Used  . Alcohol use No     Allergies   Patient has no known allergies.   Review of Systems Review of Systems  Respiratory: Negative for shortness of breath.   Cardiovascular: Negative for chest pain.  Gastrointestinal: Negative for abdominal pain.  Psychiatric/Behavioral: Positive for agitation and sleep disturbance. Negative for confusion and suicidal ideas. The patient is not nervous/anxious.   All other systems reviewed and are negative.    Physical Exam Updated Vital Signs BP 120/82 (BP Location: Right Arm)   Pulse 74   Temp 98.4 F (36.9 C) (Oral)   Resp 16   SpO2 96%   Physical Exam  Constitutional: He is oriented to person, place, and time. He appears well-developed and well-nourished.  Very agitated and threatening on initial evaluation  HENT:  Head: Normocephalic and atraumatic.  Cardiovascular: Normal rate and regular rhythm.   Pulmonary/Chest: Effort normal. No respiratory distress.  Neurological: He is alert and oriented to person, place, and time.  Skin:  Skin is warm and dry.  Blisters bilateral feet, no adjacent erythema  Psychiatric: He has a normal mood and affect.  Nursing note and vitals reviewed.    ED Treatments / Results  Labs (all labs ordered are listed, but only abnormal results are displayed) Labs Reviewed  BASIC METABOLIC PANEL - Abnormal; Notable for the following:       Result Value   Glucose, Bld 102 (*)    All other components within normal limits  CBC WITH DIFFERENTIAL/PLATELET  ETHANOL    EKG  EKG Interpretation None        Radiology No results found.  Procedures Procedures (including critical care time)  Medications Ordered in ED Medications - No data to display   Initial Impression / Assessment and Plan / ED Course  I have reviewed the triage vital signs and the nursing notes.  Pertinent labs & imaging results that were available during my care of the patient were reviewed by me and considered in my medical decision making (see chart for details).  Clinical Course    Patient initially presented with complaints of bilateral foot pain. He then complained that he was out of his antipsychotic medication. He is progressively more aggressive with staff and loud. He is difficult to redirect. He is awake, alert, and oriented. He does not appear to be psychotic. He denies SI or HI. Vital signs are reassuring. I have offered the patient TTS evaluation regarding his medications. However, patient continued to be aggressive and abrasive to staff members. For this reason he was discharged and escorted out.  I feel he was appropriately medically screened.  After history, exam, and medical workup I feel the patient has been appropriately medically screened and is safe for discharge home. Pertinent diagnoses were discussed with the patient. Patient was given return precautions.   Final Clinical Impressions(s) / ED Diagnoses   Final diagnoses:  Pain in both feet  Aggression    New Prescriptions Discharge Medication List as of 04/22/2016  3:11 AM       Shon Batonourtney F Valentin Benney, MD 04/22/16 [phone]
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
## 2016-04-22 NOTE — ED Notes (Addendum)
MD, RN, NT and security in room to assess pt due to his verbal aggression.  He is requesting an antipsychotic medication.  He reports that he is unable to get the medication and feels that he is withdrawal ing from the medication.  Reports that his feet are causing pain.  Reports he is feeling like hurting others.  Denies Hallucination or other psychotic symptoms.  Reports no sleep, displays rapid speech and

## 2016-04-24 ENCOUNTER — Encounter: Payer: Self-pay | Admitting: Pediatric Intensive Care

## 2016-04-26 ENCOUNTER — Encounter (HOSPITAL_COMMUNITY): Payer: Self-pay | Admitting: Emergency Medicine

## 2016-04-26 ENCOUNTER — Emergency Department (HOSPITAL_COMMUNITY): Payer: Medicare Other

## 2016-04-26 ENCOUNTER — Emergency Department (HOSPITAL_COMMUNITY)
Admission: EM | Admit: 2016-04-26 | Discharge: 2016-04-26 | Disposition: A | Discharge status: Home / Self Care | Payer: Medicare Other | Attending: Emergency Medicine | Admitting: Emergency Medicine

## 2016-04-26 DIAGNOSIS — Y999 Unspecified external cause status: Secondary | ICD-10-CM | POA: Diagnosis not present

## 2016-04-26 DIAGNOSIS — Y9301 Activity, walking, marching and hiking: Secondary | ICD-10-CM | POA: Diagnosis not present

## 2016-04-26 DIAGNOSIS — R51 Headache: Secondary | ICD-10-CM | POA: Diagnosis not present

## 2016-04-26 DIAGNOSIS — S01311A Laceration without foreign body of right ear, initial encounter: Secondary | ICD-10-CM | POA: Insufficient documentation

## 2016-04-26 DIAGNOSIS — F17210 Nicotine dependence, cigarettes, uncomplicated: Secondary | ICD-10-CM | POA: Diagnosis not present

## 2016-04-26 DIAGNOSIS — Y929 Unspecified place or not applicable: Secondary | ICD-10-CM | POA: Insufficient documentation

## 2016-04-26 MED ORDER — CIPROFLOXACIN HCL 500 MG PO TABS: 500.0000 mg | ORAL_TABLET | Freq: Two times a day (BID) | ORAL | 0 refills | Status: AC

## 2016-04-26 MED ORDER — LIDOCAINE HCL 1 % IJ SOLN
10.0000 mL | Freq: Once | INTRAMUSCULAR | Status: AC
  Administered 2016-04-26: 10 mL via INTRADERMAL
  Filled 2016-04-26: qty 20

## 2016-04-26 MED ORDER — IBUPROFEN 600 MG PO TABS: 600.0000 mg | ORAL_TABLET | Freq: Three times a day (TID) | ORAL | 0 refills | Status: DC | PRN

## 2016-04-26 MED ORDER — LORAZEPAM 1 MG PO TABS
2.0000 mg | ORAL_TABLET | Freq: Once | ORAL | Status: AC
  Administered 2016-04-26: 2 mg via ORAL
  Filled 2016-04-26: qty 2

## 2016-04-26 MED ORDER — SULFAMETHOXAZOLE-TRIMETHOPRIM 800-160 MG PO TABS: 1.0000 | ORAL_TABLET | Freq: Two times a day (BID) | ORAL | 0 refills | Status: AC

## 2016-04-26 MED ORDER — HALOPERIDOL LACTATE 5 MG/ML IJ SOLN: 5.0000 mg | Freq: Once | INTRAMUSCULAR | Status: DC

## 2016-04-26 MED ORDER — CIPROFLOXACIN HCL 500 MG PO TABS
500.0000 mg | ORAL_TABLET | Freq: Once | ORAL | Status: AC
  Administered 2016-04-26: 500 mg via ORAL
  Filled 2016-04-26: qty 1

## 2016-04-26 MED ORDER — ILOPERIDONE 4 MG PO TABS: 2.0000 mg | ORAL_TABLET | Freq: Once | ORAL | Status: DC

## 2016-04-26 MED ORDER — TETANUS-DIPHTH-ACELL PERTUSSIS 5-2.5-18.5 LF-MCG/0.5 IM SUSP
0.5000 mL | Freq: Once | INTRAMUSCULAR | Status: AC
  Administered 2016-04-26: 0.5 mL via INTRAMUSCULAR
  Filled 2016-04-26: qty 0.5

## 2016-04-26 MED ORDER — SULFAMETHOXAZOLE-TRIMETHOPRIM 800-160 MG PO TABS
1.0000 | ORAL_TABLET | Freq: Once | ORAL | Status: AC
  Administered 2016-04-26: 1 via ORAL
  Filled 2016-04-26: qty 1

## 2016-04-26 MED ORDER — IBUPROFEN 200 MG PO TABS
600.0000 mg | ORAL_TABLET | Freq: Once | ORAL | Status: AC
  Administered 2016-04-26: 600 mg via ORAL
  Filled 2016-04-26: qty 3

## 2016-04-26 MED ORDER — HYDROCODONE-ACETAMINOPHEN 5-325 MG PO TABS: 2.0000 | ORAL_TABLET | Freq: Once | ORAL | Status: DC
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
## 2016-04-26 NOTE — ED Triage Notes (Signed)
Pt would not answer questions or show ear

## 2016-04-26 NOTE — ED Notes (Signed)
ED Provider at bedside. 
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
## 2016-04-26 NOTE — ED Notes (Signed)
EDP at bedside suturing  
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
## 2016-05-09 NOTE — Congregational Nurse Program (Signed)
Congregational Nurse Program Note  Date of Encounter: 04/24/2016  Past Medical History: Past Medical History:  Diagnosis Date  . Anxiety   . Bipolar disorder (HCC)   . Homelessness     Encounter Details:     CNP Questionnaire - 04/24/16 0930      Patient Demographics   Is this a new or existing patient? New   Patient is considered a/an Not Applicable   Race Caucasian/White     Patient Assistance   Location of Patient Assistance [facility]   Patient's financial/insurance status Medicaid;Medicare   Uninsured Patient ([employer] officer, trade unionCard/Care Connects) No   Patient referred to apply for the following financial assistance Not Applicable   Food insecurities addressed Not Applicable   Transportation assistance Yes   Type of Assistance Bus Pass Given   Assistance securing medications Yes   Type of Holiday representativeAssistance [facility]   Educational health offerings Acute disease;Medications;Navigating the healthcare system     Encounter Details   Primary purpose of visit Acute Illness/Condition Visit;Post ED/Hospitalization Visit   Was an Emergency Department visit averted? Not Applicable   Does patient have a medical provider? No   Patient referred to Urgent Care   Was a mental health screening completed? (GAINS tool) No   Does patient have dental issues? No   Does patient have vision issues? No   Does your patient have an abnormal blood pressure today? No   Since previous encounter, have you referred patient for abnormal blood pressure that resulted in a new diagnosis or medication change? No   Does your patient have an abnormal blood glucose today? No   Since previous encounter, have you referred patient for abnormal blood glucose that resulted in a new diagnosis or medication change? No   Was there a life-saving intervention made? No     New client- states that he was seen in ED a few days ago due to foot blisters as he's been walking a lot. Client has labile mood and idea flight. States he  does not have a medical provider. Also states that he has been out of his medication (Fanapt) for 2 weeks. Denies injury to affected (left) foot. Has pin-point petechiae on top of foot as well as 2 small abrasions on inside of ankle. There is a 4cmx2cm open blister on top of left foot as well with some yellow exudate. There is no edema. CN advised client to wash area daily with soap and water and to keep area open to air as much as tolerated. CN also advised client to return to Urgent Care to have foot evaluated due to presence of petechiae. Cn will obtain medication for client via [facility].

## 2016-08-14 ENCOUNTER — Encounter (HOSPITAL_COMMUNITY): Payer: Self-pay | Admitting: Emergency Medicine

## 2016-08-14 ENCOUNTER — Emergency Department (HOSPITAL_COMMUNITY)
Admission: EM | Admit: 2016-08-14 | Discharge: 2016-08-17 | Disposition: A | Discharge status: Home / Self Care | Payer: Medicare Other | Attending: Emergency Medicine | Admitting: Emergency Medicine

## 2016-08-14 DIAGNOSIS — F319 Bipolar disorder, unspecified: Secondary | ICD-10-CM | POA: Diagnosis present

## 2016-08-14 DIAGNOSIS — F17210 Nicotine dependence, cigarettes, uncomplicated: Secondary | ICD-10-CM | POA: Insufficient documentation

## 2016-08-14 DIAGNOSIS — Z79899 Other long term (current) drug therapy: Secondary | ICD-10-CM | POA: Diagnosis not present

## 2016-08-14 DIAGNOSIS — F3113 Bipolar disorder, current episode manic without psychotic features, severe: Secondary | ICD-10-CM | POA: Diagnosis not present

## 2016-08-14 DIAGNOSIS — F3112 Bipolar disorder, current episode manic without psychotic features, moderate: Secondary | ICD-10-CM | POA: Diagnosis not present

## 2016-08-14 DIAGNOSIS — F918 Other conduct disorders: Secondary | ICD-10-CM | POA: Diagnosis present

## 2016-08-14 LAB — COMPREHENSIVE METABOLIC PANEL
ALBUMIN: 4.4 g/dL (ref 3.5–5.0)
ALT: 22 U/L (ref 17–63)
AST: 25 U/L (ref 15–41)
Alkaline Phosphatase: 52 U/L (ref 38–126)
Anion gap: 7 (ref 5–15)
BUN: 13 mg/dL (ref 6–20)
CHLORIDE: 101 mmol/L (ref 101–111)
CO2: 28 mmol/L (ref 22–32)
CREATININE: 0.73 mg/dL (ref 0.61–1.24)
Calcium: 9.1 mg/dL (ref 8.9–10.3)
GFR calc non Af Amer: >60 mL/min (ref >60)
GLUCOSE: 136 mg/dL — AB (ref 65–99)
Potassium: 3.8 mmol/L (ref 3.5–5.1)
SODIUM: 136 mmol/L (ref 135–145)
Total Bilirubin: 0.6 mg/dL (ref 0.3–1.2)
Total Protein: 7.4 g/dL (ref 6.5–8.1)

## 2016-08-14 LAB — ACETAMINOPHEN LEVEL

## 2016-08-14 LAB — CBC
HEMATOCRIT: 41.5 % (ref 39.0–52.0)
HEMOGLOBIN: 13.5 g/dL (ref 13.0–17.0)
MCH: 27.6 pg (ref 26.0–34.0)
MCHC: 32.5 g/dL (ref 30.0–36.0)
MCV: 84.7 fL (ref 78.0–100.0)
Platelets: 267 10*3/uL (ref 150–400)
RBC: 4.9 MIL/uL (ref 4.22–5.81)
RDW: 14.2 % (ref 11.5–15.5)
WBC: 8.2 10*3/uL (ref 4.0–10.5)

## 2016-08-14 LAB — RAPID URINE DRUG SCREEN, HOSP PERFORMED
AMPHETAMINES: NOT DETECTED
Barbiturates: NOT DETECTED
Benzodiazepines: NOT DETECTED
COCAINE: NOT DETECTED
OPIATES: NOT DETECTED
TETRAHYDROCANNABINOL: NOT DETECTED

## 2016-08-14 LAB — SALICYLATE LEVEL

## 2016-08-14 LAB — ETHANOL: Alcohol, Ethyl (B): <5 mg/dL (ref <5)

## 2016-08-14 MED ORDER — HYDROXYZINE HCL 25 MG PO TABS
50.0000 mg | ORAL_TABLET | Freq: Once | ORAL | Status: DC
  Filled 2016-08-14: qty 2

## 2016-08-14 MED ORDER — ILOPERIDONE 4 MG PO TABS
2.0000 mg | ORAL_TABLET | Freq: Two times a day (BID) | ORAL | Status: DC
  Administered 2016-08-14 – 2016-08-16 (×3): 2 mg via ORAL
  Filled 2016-08-14 (×6): qty 1

## 2016-08-14 MED ORDER — NICOTINE 21 MG/24HR TD PT24
21.0000 mg | MEDICATED_PATCH | Freq: Once | TRANSDERMAL | Status: AC
  Administered 2016-08-14: 21 mg via TRANSDERMAL
  Filled 2016-08-14: qty 1

## 2016-08-14 MED ORDER — ILOPERIDONE 2 MG PO TABS: 2.0000 mg | ORAL_TABLET | Freq: Two times a day (BID) | ORAL | Status: DC

## 2016-08-14 MED ORDER — OLANZAPINE 5 MG PO TBDP
5.0000 mg | ORAL_TABLET | Freq: Every day | ORAL | Status: DC
  Filled 2016-08-14 (×2): qty 1

## 2016-08-14 NOTE — ED Provider Notes (Signed)
[facility]-EMERGENCY DEPT Provider Note   CSN: [id number] Arrival date & time: 08/14/16  1210     History   Chief Complaint Chief Complaint  Patient presents with  . Aggressive Behavior  . Medical Clearance    HPI Timothy Prince is a 36 y.o. male.  HPI  36 yo M with bipolar disorder here with agitation. Pt reportedly caught trespassing and arrested for outstanding warrant. He states he is being persecuted against by "the authority." he has a h/o bipolar disorder but states that God heals him so he has not taken any medications. He does not feel that he needs medicine. He states he rarely sleeps but this is because he has so much to do. 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 Denies any recent medical changes. He was just kicked out of a "bad living situation."  Past Medical History:  Diagnosis Date  . Anxiety   . Bipolar disorder (HCC)   . Homelessness     Patient Active Problem List   Diagnosis Date Noted  . PHARYNGITIS 07/25/2008  . VIRAL URI 07/25/2008    History reviewed. No pertinent surgical history.     Home Medications    Prior to Admission medications   Medication Sig Start Date End Date Taking? Authorizing Provider  ibuprofen (ADVIL,MOTRIN) 800 MG tablet Take 800 mg by mouth every 8 (eight) hours as needed for mild pain or moderate pain.   Yes Historical Provider, MD  Iloperidone (FANAPT) 2 MG TABS Take 2 mg by mouth 2 (two) times daily.   Yes Historical Provider, MD    Family History No family history on file.  Social History Social History  Substance Use Topics  . Smoking status: Current Every Day Smoker    Packs/day: 1.00    Types: Cigarettes  . Smokeless tobacco: Never Used  . Alcohol use No     Allergies   Patient has no known allergies.   Review of Systems Review of Systems  Constitutional: Negative for chills, fatigue and fever.  HENT: Negative for congestion and rhinorrhea.   Eyes: Negative for  visual disturbance.  Respiratory: Negative for cough, shortness of breath and wheezing.   Cardiovascular: Negative for chest pain and leg swelling.  Gastrointestinal: Negative for abdominal pain, diarrhea, nausea and vomiting.  Genitourinary: Negative for dysuria and flank pain.  Musculoskeletal: Negative for neck pain and neck stiffness.  Skin: Negative for rash and wound.  Allergic/Immunologic: Negative for immunocompromised state.  Neurological: Negative for syncope, weakness and headaches.  Psychiatric/Behavioral: Positive for agitation, behavioral problems, confusion, decreased concentration and sleep disturbance. The patient is nervous/anxious and is hyperactive.   All other systems reviewed and are negative.    Physical Exam Updated Vital Signs BP 132/71   Pulse 92   Temp 98.6 F (37 C) (Oral)   Resp 18   SpO2 96%   Physical Exam  Constitutional: He is oriented to person, place, and time. He appears well-developed and well-nourished. No distress.  HENT:  Head: Normocephalic and atraumatic.  Eyes: Conjunctivae are normal.  Neck: Neck supple.  Cardiovascular: Normal rate, regular rhythm and normal heart sounds.   Pulmonary/Chest: Effort normal. No respiratory distress. He has no wheezes.  Abdominal: He exhibits no distension.  Musculoskeletal: He exhibits no edema.  Neurological: He is alert and oriented to person, place, and time. He exhibits normal muscle tone.  Skin: Skin is warm. Capillary refill takes less than 2 seconds. No rash noted.  Psychiatric: His  mood appears anxious. His affect is angry and labile. His speech is rapid and/or pressured. He is agitated and hyperactive. Thought content is paranoid. Cognition and memory are impaired. He expresses impulsivity and inappropriate judgment.  Nursing note and vitals reviewed.    ED Treatments / Results  Labs (all labs ordered are listed, but only abnormal results are displayed) Labs Reviewed  COMPREHENSIVE  METABOLIC PANEL - Abnormal; Notable for the following:       Result Value   Glucose, Bld 136 (*)    All other components within normal limits  ACETAMINOPHEN LEVEL - Abnormal; Notable for the following:    Acetaminophen (Tylenol), Serum <10 (*)    All other components within normal limits  ETHANOL  SALICYLATE LEVEL  CBC  RAPID URINE DRUG SCREEN, HOSP PERFORMED    EKG  EKG Interpretation None       Radiology No results found.  Procedures Procedures (including critical care time)  Medications Ordered in ED Medications  hydrOXYzine (ATARAX/VISTARIL) tablet 50 mg (50 mg Oral Refused 08/14/16 1448)  nicotine (NICODERM CQ - dosed in mg/24 hours) patch 21 mg (21 mg Transdermal Patch Removed 08/15/16 1448)  iloperidone (FANAPT) tablet 2 mg (2 mg Oral Given 08/14/16 1539)  OLANZapine zydis (ZYPREXA) disintegrating tablet 5 mg (5 mg Oral Refused 08/14/16 1652)     Initial Impression / Assessment and Plan / ED Course  I have reviewed the triage vital signs and the nursing notes.  Pertinent labs & imaging results that were available during my care of the patient were reviewed by me and considered in my medical decision making (see chart for details).    36 yo m with h/o bipolar disorder, not on any meds, here for med clearance. On assessment, pt does have some sx concerning for developing mania with hyperreligiosity, pressured speech. Not taking meds. While many of these may be chronic, unclear if his current presentation is 2/2 primary underlying mental illness versus behavioral disturbance. Will c/s TTS as pt may need inpt tx for mania.  Final Clinical Impressions(s) / ED Diagnoses   Final diagnoses:  Manic behavior (HCC)    New Prescriptions New Prescriptions   No medications on file     Shaune Pollack, MD 08/14/16 1735

## 2016-08-14 NOTE — ED Notes (Signed)
Pt standing in doorway at this time refusing to remove shirt. Pt verbally aggressive with staff. Pt hit left chest with right palm. Left chest is reddened at this time.

## 2016-08-14 NOTE — ED Notes (Signed)
Bed: [facility]43 Expected date:  Expected time:  Means of arrival:  Comments: Hold for 29 

## 2016-08-14 NOTE — ED Notes (Signed)
Pt verbally abusive during medication administration. [employer] at bedside at this time

## 2016-08-14 NOTE — ED Notes (Signed)
MD at bedside at this time.
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
## 2016-08-14 NOTE — ED Notes (Signed)
SBAR Report received from previous nurse. Pt received calm and visible on unit. Pt denies current SI/ HI, A/V H, depression, anxiety, or pain at this time, and appears otherwise stable and free of distress. pt denies need to be here stating that he got himself IVC on purpose. Pt is grandiose, and extremely needy/ demanding. Pt was able to accept re-direction at times and after considerable 1:1 time with writer assisting him to calm self, and answer his repetitive questions. Pt reminded of camera surveillance, q 15 min rounds, and rules of the milieu. Will continue to assess.

## 2016-08-14 NOTE — ED Notes (Signed)
Pt admitted to room #43 IVC. Pt irritable,  pt speech, loud, pressured, tangential. Pt disorganized and defensive on approach. Pt denies SI/HI/AVH. Pt does report decrease in sleep. Encouragement and support provided. Special checks q 15 mins in place for safety. Video monitoring in place. Will continue to monitor.

## 2016-08-14 NOTE — ED Notes (Signed)
Per RN, please wait to obtain labs. Dr is assessing patient and is trying to determine whether they are sending patient back to jail or if he is going to stay.

## 2016-08-14 NOTE — BH Assessment (Signed)
[facility] Assessment Progress Note  Issacs MD was contacted due to patient's escalating behaviors and patient was IVCed. Case was staffed with Dominga Ferry NP who recommended patient be re-evaluated in the a.m.

## 2016-08-14 NOTE — ED Triage Notes (Signed)
Pt under care of [employer] and here for medical clearance. Pt complaint of AH and HI. Pt aggressive and loud with triage.

## 2016-08-15 DIAGNOSIS — F17210 Nicotine dependence, cigarettes, uncomplicated: Secondary | ICD-10-CM | POA: Diagnosis not present

## 2016-08-15 DIAGNOSIS — F319 Bipolar disorder, unspecified: Secondary | ICD-10-CM | POA: Diagnosis present

## 2016-08-15 DIAGNOSIS — Z79899 Other long term (current) drug therapy: Secondary | ICD-10-CM

## 2016-08-15 DIAGNOSIS — F3113 Bipolar disorder, current episode manic without psychotic features, severe: Secondary | ICD-10-CM | POA: Diagnosis not present

## 2016-08-15 MED ORDER — ACETAMINOPHEN 500 MG PO TABS
1000.0000 mg | ORAL_TABLET | Freq: Once | ORAL | Status: AC
  Administered 2016-08-15: 500 mg via ORAL
  Filled 2016-08-15: qty 2

## 2016-08-15 MED ORDER — LORAZEPAM 2 MG/ML IJ SOLN
INTRAMUSCULAR | Status: AC
  Filled 2016-08-15: qty 1

## 2016-08-15 MED ORDER — NICOTINE 21 MG/24HR TD PT24
21.0000 mg | MEDICATED_PATCH | Freq: Every day | TRANSDERMAL | Status: DC
  Administered 2016-08-15 – 2016-08-17 (×4): 21 mg via TRANSDERMAL
  Filled 2016-08-15 (×4): qty 1

## 2016-08-15 MED ORDER — LORAZEPAM 2 MG/ML IJ SOLN: 1.0000 mg | Freq: Once | INTRAMUSCULAR | Status: DC

## 2016-08-15 MED ORDER — ILOPERIDONE 4 MG PO TABS
2.0000 mg | ORAL_TABLET | Freq: Two times a day (BID) | ORAL | Status: DC | PRN
  Filled 2016-08-15: qty 1

## 2016-08-15 MED ORDER — ZIPRASIDONE MESYLATE 20 MG IM SOLR: 20.0000 mg | Freq: Two times a day (BID) | INTRAMUSCULAR | Status: DC

## 2016-08-15 MED ORDER — STERILE WATER FOR INJECTION IJ SOLN
INTRAMUSCULAR | Status: AC
  Administered 2016-08-15: 09:00:00
  Filled 2016-08-15: qty 10

## 2016-08-15 MED ORDER — LORAZEPAM 2 MG/ML IJ SOLN: 2.0000 mg | Freq: Once | INTRAMUSCULAR | Status: DC

## 2016-08-15 MED ORDER — LORAZEPAM 1 MG PO TABS
2.0000 mg | ORAL_TABLET | Freq: Four times a day (QID) | ORAL | Status: DC | PRN
  Administered 2016-08-16 – 2016-08-17 (×2): 2 mg via ORAL
  Filled 2016-08-15 (×2): qty 2

## 2016-08-15 MED ORDER — ZIPRASIDONE MESYLATE 20 MG IM SOLR: 20.0000 mg | Freq: Once | INTRAMUSCULAR | Status: DC

## 2016-08-15 MED ORDER — ZIPRASIDONE MESYLATE 20 MG IM SOLR
INTRAMUSCULAR | Status: AC
  Filled 2016-08-15: qty 20

## 2016-08-15 MED ORDER — DIPHENHYDRAMINE HCL 50 MG/ML IJ SOLN: 25.0000 mg | Freq: Once | INTRAMUSCULAR | Status: DC

## 2016-08-15 MED ORDER — LORAZEPAM 2 MG/ML IJ SOLN
2.0000 mg | Freq: Once | INTRAMUSCULAR | Status: AC
  Administered 2016-08-15: 09:00:00 via INTRAMUSCULAR

## 2016-08-15 MED ORDER — CARBAMAZEPINE ER 200 MG PO TB12
200.0000 mg | ORAL_TABLET | Freq: Two times a day (BID) | ORAL | Status: DC
  Administered 2016-08-15 – 2016-08-16 (×2): 200 mg via ORAL
  Filled 2016-08-15 (×2): qty 1

## 2016-08-15 MED ORDER — LORAZEPAM 2 MG/ML IJ SOLN: 2.0000 mg | Freq: Four times a day (QID) | INTRAMUSCULAR | Status: DC | PRN

## 2016-08-15 MED ORDER — ZIPRASIDONE MESYLATE 20 MG IM SOLR
20.0000 mg | Freq: Once | INTRAMUSCULAR | Status: AC
  Administered 2016-08-15: 09:00:00 via INTRAMUSCULAR

## 2016-08-15 NOTE — ED Notes (Addendum)
SBAR Report received from previous nurse. Pt received calm and visible on unit. Pt denies current SI/ HI, A/V H, depression, anxiety, or pain at this time, and appears otherwise stable and free of distress though has pressured speech, is demanding, is irritable is attempting to argue with staff. Pt reminded of camera surveillance, q 15 min rounds, and rules of the milieu. Will continue to assess, pt remains on 1:1. Pt has refused 12 lead.

## 2016-08-15 NOTE — ED Notes (Signed)
Pt continues to be on 1:1 observation. He is asleep, breathing easy, moving and changing position in bed.
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
## 2016-08-15 NOTE — ED Notes (Signed)
Pt refused EKG. "I just don't feel like having those things stuck all over me."

## 2016-08-15 NOTE — Consult Note (Signed)
[facility] Psych ED Progress Note  08/15/2016 1:11 PM Sathvik Tiedt  MRN:  [id number] Subjective:   Artist Bloom is a 36 year old male with bipolar disorder, who reportedly caught trespassing and arrested for outstanding warrant. Per chart review, he states he is being persecuted against by "the authority." He has not taken his medication as "God heals him" He reportedly states that he is the [place] of three companies but is currently homeless. Denies any recent medical changes.   - Per nursing report, he has been agitated, loud, and threatening to staff.He went into verbal altercations with other patient and started punching the room.  He declined to take scheduled medication.   On evaluation, he was loud, had pressured speech, disorganized and was not redirectable. Given its acuity and danger to others, he was given Ziprasidone and ativan IM.  When our team attempted to evaluate the patient, patient was asleep in the seclusion room. Discussed with nursing staff; he later agreed to stay calm and went back to his room. He was placed on 1:1 sitter.   Principal Problem: <principal problem not specified> Diagnosis:   Patient Active Problem List   Diagnosis Date Noted  . Bipolar disorder ([place]) [F31.9] 08/15/2016  . PHARYNGITIS [J02.9] 07/25/2008  . VIRAL URI [J06.9] 07/25/2008   Total Time spent with patient: 20 minutes  Past Psychiatric History: bipolar disorder, patient is not able to elaborate the history   Past Medical History:  Past Medical History:  Diagnosis Date  . Anxiety   . Bipolar disorder ([place])   . Homelessness    History reviewed. No pertinent surgical history. Family History: No family history on file. Family Psychiatric  History: unable to obtain due to patient mental status Social History:  History  Alcohol Use No     History  Drug Use No    Social History   Social History  . Marital status: Married    Spouse name: N/A  . Number of children: N/A  . Years of  education: N/A   Social History Main Topics  . Smoking status: Current Every Day Smoker    Packs/day: 1.00    Types: Cigarettes  . Smokeless tobacco: Never Used  . Alcohol use No  . Drug use: No  . Sexual activity: Not Asked   Other Topics Concern  . None   Social History Narrative  . None    Sleep: Poor  Appetite:  Fair  Current Medications: Current Facility-Administered Medications  Medication Dose Route Frequency Provider Last Rate Last Dose  . diphenhydrAMINE (BENADRYL) injection 25 mg  25 mg Intramuscular Once Norman Clay, MD      . hydrOXYzine (ATARAX/VISTARIL) tablet 50 mg  50 mg Oral Once Duffy Bruce, MD      . iloperidone (FANAPT) tablet 2 mg  2 mg Oral BID Duffy Bruce, MD   2 mg at 08/14/16 1539  . LORazepam (ATIVAN) injection 2 mg  2 mg Intramuscular Once Lurena Nida, NP      . nicotine (NICODERM CQ - dosed in mg/24 hours) patch 21 mg  21 mg Transdermal Once Duffy Bruce, MD   21 mg at 08/14/16 1448  . nicotine (NICODERM CQ - dosed in mg/24 hours) patch 21 mg  21 mg Transdermal Daily Lurena Nida, NP      . OLANZapine zydis (ZYPREXA) disintegrating tablet 5 mg  5 mg Oral QHS Duffy Bruce, MD      . ziprasidone (GEODON) injection 20 mg  20 mg Intramuscular Once  Lurena Nida, NP       Current Outpatient Prescriptions  Medication Sig Dispense Refill  . ibuprofen (ADVIL,MOTRIN) 800 MG tablet Take 800 mg by mouth every 8 (eight) hours as needed for mild pain or moderate pain.    . Iloperidone (FANAPT) 2 MG TABS Take 2 mg by mouth 2 (two) times daily.      Lab Results:  Results for orders placed or performed during the hospital encounter of 08/14/16 (from the past 48 hour(s))  Rapid urine drug screen (hospital performed)     Status: None   Collection Time: 08/14/16  1:09 PM  Result Value Ref Range   Opiates NONE DETECTED NONE DETECTED   Cocaine NONE DETECTED NONE DETECTED   Benzodiazepines NONE DETECTED NONE DETECTED   Amphetamines NONE DETECTED  NONE DETECTED   Tetrahydrocannabinol NONE DETECTED NONE DETECTED   Barbiturates NONE DETECTED NONE DETECTED    Comment:        DRUG SCREEN FOR MEDICAL PURPOSES ONLY.  IF CONFIRMATION IS NEEDED FOR ANY PURPOSE, NOTIFY LAB WITHIN 5 DAYS.        LOWEST DETECTABLE LIMITS FOR URINE DRUG SCREEN Drug Class       Cutoff (ng/mL) Amphetamine      1000 Barbiturate      200 Benzodiazepine   768 Tricyclics       088 Opiates          300 Cocaine          300 THC              50   Comprehensive metabolic panel     Status: Abnormal   Collection Time: 08/14/16  1:55 PM  Result Value Ref Range   Sodium 136 135 - 145 mmol/L   Potassium 3.8 3.5 - 5.1 mmol/L   Chloride 101 101 - 111 mmol/L   CO2 28 22 - 32 mmol/L   Glucose, Bld 136 (H) 65 - 99 mg/dL   BUN 13 6 - 20 mg/dL   Creatinine, Ser 0.73 0.61 - 1.24 mg/dL   Calcium 9.1 8.9 - 10.3 mg/dL   Total Protein 7.4 6.5 - 8.1 g/dL   Albumin 4.4 3.5 - 5.0 g/dL   AST 25 15 - 41 U/L   ALT 22 17 - 63 U/L   Alkaline Phosphatase 52 38 - 126 U/L   Total Bilirubin 0.6 0.3 - 1.2 mg/dL   GFR calc non Af Amer >60 >60 mL/min   GFR calc Af Amer >60 >60 mL/min    Comment: (NOTE) The eGFR has been calculated using the CKD EPI equation. This calculation has not been validated in all clinical situations. eGFR's persistently <60 mL/min signify possible Chronic Kidney Disease.    Anion gap 7 5 - 15  Ethanol     Status: None   Collection Time: 08/14/16  1:55 PM  Result Value Ref Range   Alcohol, Ethyl (B) <5 <5 mg/dL    Comment:        LOWEST DETECTABLE LIMIT FOR SERUM ALCOHOL IS 5 mg/dL FOR MEDICAL PURPOSES ONLY   Salicylate level     Status: None   Collection Time: 08/14/16  1:55 PM  Result Value Ref Range   Salicylate Lvl <1.1 2.8 - 30.0 mg/dL  Acetaminophen level     Status: Abnormal   Collection Time: 08/14/16  1:55 PM  Result Value Ref Range   Acetaminophen (Tylenol), Serum <10 (L) 10 - 30 ug/mL    Comment:  THERAPEUTIC CONCENTRATIONS  VARY SIGNIFICANTLY. A RANGE OF 10-30 ug/mL MAY BE AN EFFECTIVE CONCENTRATION FOR MANY PATIENTS. HOWEVER, SOME ARE BEST TREATED AT CONCENTRATIONS OUTSIDE THIS RANGE. ACETAMINOPHEN CONCENTRATIONS >150 ug/mL AT 4 HOURS AFTER INGESTION AND >50 ug/mL AT 12 HOURS AFTER INGESTION ARE OFTEN ASSOCIATED WITH TOXIC REACTIONS.   cbc     Status: None   Collection Time: 08/14/16  1:55 PM  Result Value Ref Range   WBC 8.2 4.0 - 10.5 K/uL   RBC 4.90 4.22 - 5.81 MIL/uL   Hemoglobin 13.5 13.0 - 17.0 g/dL   HCT 41.5 39.0 - 52.0 %   MCV 84.7 78.0 - 100.0 fL   MCH 27.6 26.0 - 34.0 pg   MCHC 32.5 30.0 - 36.0 g/dL   RDW 14.2 11.5 - 15.5 %   Platelets 267 150 - 400 K/uL    Blood Alcohol level:  Lab Results  Component Value Date   ETH <5 08/14/2016   ETH <5 04/22/2016    Physical Findings: AIMS:  , ,  ,  ,    CIWA:    COWS:     Musculoskeletal: Strength & Muscle Tone: within normal limits Gait & Station: normal Patient leans: N/A  Psychiatric Specialty Exam: Physical Exam  Review of Systems  Unable to perform ROS: Acuity of condition    Blood pressure 117/71, pulse 67, temperature 98.1 F (36.7 C), temperature source Oral, resp. rate 16, SpO2 99 %.There is no height or weight on file to calculate BMI.  General Appearance: Fairly Groomed  Eye Contact:  intense  Speech:  Pressured  Volume:  loud  Mood:  Angry  Affect:  irritable, angry  Thought Process:  Disorganized  Orientation:  Other:  unable to evaluate due to his mental state  Thought Content:  unable to evaluate due to his mental state  Suicidal Thoughts:  unable to evaluate due to his mental state  Homicidal Thoughts:  unable to evaluate due to his mental state  Memory:  unable to evaluate due to his mental state  Judgement:  Impaired  Insight:  Lacking  Psychomotor Activity:  Increased  Concentration:  Concentration: Poor and Attention Span: Poor  Recall:  Poor  Fund of Knowledge:  unable to evaluate due to his  mental state  Language:  unable to evaluate due to his mental state  Akathisia:  No  Handed:  Right  AIMS (if indicated):     Assets:  Physical Health  ADL's:  Intact  Cognition:  WNL  Sleep:   poor   Assessment Maximillian Habibi is a 36 year old male with bipolar disorder, who reportedly caught trespassing and arrested for outstanding warrant. Per chart review, he states he is being persecuted against by "the authority." He has not taken his medication as "God heals him" He reportedly states that he is the [place] of three companies but is currently homeless. Denies any recent medical changes.   # Bipolar I disorder Exam is notable for his agitation; he reportedly was punching the room and was threatening to staff. Will start carbamazepine and iloperidone (home meds per chart review) to target his mood symptoms/irritability.  Will have scheduled Geodon IM (given good response) if he is not amenable to take scheduled medication. Have iloperidone ativan prn available for agitation. Will monitor EKG for QTc prolongation.   Plan - Start carbamazepine 200 mg BID - Continue Iloperidone 2 mg BID. If he refuses to take this mediation, given Geodon IM 20 gm BID - Start iloperidone  2 mg BID prn for agitation - Start Ativan 2 mg PO q6hprn for agitation, or 2 mg IM q6hprn for agitation - Obtain EKG to monitor QTc prolongation  Treatment Plan Summary: Plan as above  Norman Clay, MD 08/15/2016, 1:11 PM

## 2016-08-15 NOTE — ED Provider Notes (Signed)
Called to bedside for patient agitation. He is agitated and threatening to staff. He has pressured speech that is difficult to redirect. Plan to provide chemical sedation for agitation due to concern for patient and staff safety.   Tilden Fossa, MD 08/15/16 1214

## 2016-08-15 NOTE — ED Notes (Signed)
Pt remains in the bed on a 1:1 due to aggressive behaviors towards staff and other patients. Pt in the bed resting and no physical distress noted.

## 2016-08-15 NOTE — ED Notes (Signed)
Pt now sitting up in bed eating at this time.

## 2016-08-16 MED ORDER — ILOPERIDONE 4 MG PO TABS
4.0000 mg | ORAL_TABLET | Freq: Two times a day (BID) | ORAL | Status: DC
  Administered 2016-08-16: 4 mg via ORAL
  Filled 2016-08-16 (×2): qty 1

## 2016-08-16 MED ORDER — IBUPROFEN 200 MG PO TABS
600.0000 mg | ORAL_TABLET | Freq: Four times a day (QID) | ORAL | Status: DC | PRN
  Administered 2016-08-16: 600 mg via ORAL
  Filled 2016-08-16: qty 3

## 2016-08-16 MED ORDER — GABAPENTIN 300 MG PO CAPS
300.0000 mg | ORAL_CAPSULE | Freq: Three times a day (TID) | ORAL | Status: DC
  Administered 2016-08-16 (×2): 300 mg via ORAL
  Filled 2016-08-16 (×3): qty 1

## 2016-08-16 MED ORDER — CARBAMAZEPINE ER 200 MG PO TB12
300.0000 mg | ORAL_TABLET | Freq: Two times a day (BID) | ORAL | Status: DC
  Filled 2016-08-16 (×3): qty 1

## 2016-08-16 NOTE — ED Notes (Signed)
Pt increasingly agitated, slamming phone on receiver, pt loud, yelling profanity, verbally aggressive towards nursing staff. 1:1 in place. Will continue to monitor.

## 2016-08-16 NOTE — ED Notes (Signed)
Pt demanding, irritable, appears to enjoy confrontation with nursing staff. Pt compliant with morning medication regimen, but presents with paranoia. Pt reports he was "poisoned while in jail." Pt remains on 1:1. Will continue to monitor.

## 2016-08-16 NOTE — BH Assessment (Signed)
[facility] at capacity. Faxed clinical information to the following facilities for placement:  [facility]   [address] [place], [place], [facility] Triage Specialist [phone]

## 2016-08-16 NOTE — ED Notes (Signed)
Pt talking on hallway phone, remains on 1:1 will continue to monitor.

## 2016-08-16 NOTE — ED Notes (Signed)
EKG completed and given to Dr. Vanetta Shawl for review.

## 2016-08-16 NOTE — ED Notes (Signed)
Pt remains on 1:1 observation. Pt up at nurses station verbally demanding, loud,  pt is grandiose. Encouragement and support provided. Pt remains on 1:1 observation. Will continue to monitor.

## 2016-08-16 NOTE — BH Assessment (Signed)
Reassessment:   Writer met with patient face to face. He is highly agitated with pressured speech. Patient was extremely argumentative and became irritable when asked questions. He required constant redirection and was unable to answer most questions appropriately. He rambled about various stories related to his girlfriend, wife, daughter, and various friends. He was difficult to follow evidenced by flight of ideas. He gives many conflicting stories. Denies SI, HI, and AVH's. He is oriented to time, person, place, and situation.

## 2016-08-16 NOTE — ED Notes (Signed)
SBAR Report received from previous nurse. Pt received calm and visible on unit. Pt asleep and gave no answer to assessment questions related to SI/ HI, A/V H, depression, anxiety, or pain at this time, and appears otherwise stable and free of distress. Pt reminded of camera surveillance, q 15 min rounds, and rules of the milieu. Will continue to assess.

## 2016-08-16 NOTE — Consult Note (Signed)
Brief psychiatry note.  Patient is in the hallway, loud and disorganized. Patient approached to this writer, stating that he will not take carbamazepine. However, he is amenable to increase Fanapt. He complains gabapentin makes him feels "sick" and want to discontinue this medication.   - Increase iloperidone 4 mg BID. If he refuses to take this mediation, give Geodon IM 20 gm BID - Increase carbamazepine 300 mg BID - Continue Ativan 2 mg PO q6hprn for agitation, or 2 mg IM q6hprn for agitation - EKG: NSR, QTc: 421 msec on 4/15

## 2016-08-16 NOTE — ED Provider Notes (Signed)
7:01 AM called to see patient by nurse due to complaints of bilateral toe numbness. Patient tells me he has had bilateral second and third toe numbness or several months. He has also had low back pain. However no weakness in his extremities. No numbness beyond the toes including no foot or leg numbness. He also has several other psychiatric complaints and asking for several things such as headphones, his phone, and YouTube. He appears to switch topics quickly. On exam he has slight decreased sensation over his second and third toes bilaterally. However they are warm and well perfused. Normal strength and is ambulating normally. 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 I offered him Neurontin, he agrees to take this. Continue ibuprofen and Tylenol.   Pricilla Loveless, MD 08/16/16 [phone]

## 2016-08-17 DIAGNOSIS — F3112 Bipolar disorder, current episode manic without psychotic features, moderate: Secondary | ICD-10-CM

## 2016-08-17 DIAGNOSIS — F17210 Nicotine dependence, cigarettes, uncomplicated: Secondary | ICD-10-CM | POA: Diagnosis not present

## 2016-08-17 MED ORDER — GABAPENTIN 300 MG PO CAPS: 300.0000 mg | ORAL_CAPSULE | Freq: Three times a day (TID) | ORAL | 0 refills | Status: AC

## 2016-08-17 MED ORDER — ILOPERIDONE 4 MG PO TABS
8.0000 mg | ORAL_TABLET | Freq: Two times a day (BID) | ORAL | Status: DC
  Administered 2016-08-17: 2 mg via ORAL
  Filled 2016-08-17: qty 2

## 2016-08-17 MED ORDER — ILOPERIDONE 4 MG PO TABS: 8.0000 mg | ORAL_TABLET | Freq: Two times a day (BID) | ORAL | 0 refills | Status: AC

## 2016-08-17 NOTE — ED Notes (Signed)
Continues to be demanding and beligerent.

## 2016-08-17 NOTE — ED Notes (Signed)
Patient stated to another mental health tech that they are only here for one reason.

## 2016-08-17 NOTE — ED Notes (Signed)
Patient is alert and oriented. Speech is linear and goal directed.  He is not responding to internal stimuli.  States "I am ready to leave."  Demanding to open day room. He has showered and no s/s of delusional thought assesed. Continue 1:1 for safety and observation.

## 2016-08-17 NOTE — Consult Note (Signed)
[facility] Face-to-Face Psychiatry Consult   Reason for Consult:  Mania Referring Physician:  EDP Patient Identification: Timothy Prince MRN:  [id number] Principal Diagnosis: Bipolar disorder, current episode manic without psychotic features, moderate (HCC) Diagnosis:   Patient Active Problem List   Diagnosis Date Noted  . Bipolar disorder, current episode manic without psychotic features, moderate (HCC) [F31.12] 08/17/2016    Priority: High  . PHARYNGITIS [J02.9] 07/25/2008  . VIRAL URI [J06.9] 07/25/2008    Total Time spent with patient: 30 minutes  Subjective:   Timothy Prince is a 36 y.o. male patient has stabilized.  HPI:  36 yo male who came to the ED by police for mania symptoms.  No suicidal/homicidal ideations but agitated and threatening staff.  He was started on medications and stabilized.  Denies suicidal/homicidal ideations, hallucinations, or alcohol drug abuse.  He was brought by police for assault charges and to discharge to their custody for multiple charges.  He was very threatening to staff and other patients during his stay with frequent demands for resources for himself and girlfriend who is in the lobby.  It was reported by staff that he was here to avoid jail.  Past Psychiatric History: bipolar disorder  Risk to Self: None Risk to Others: None Prior Inpatient Therapy: Prior Inpatient Therapy: Yes Prior Therapy Dates: 2011 Prior Therapy Facilty/Provider(s): [facility] Reason for Treatment: MH issues Prior Outpatient Therapy: Prior Outpatient Therapy: No Prior Therapy Dates: na (na) Prior Therapy Facilty/Provider(s): na Reason for Treatment:  (na) Does patient have an ACCT team?: No Does patient have Intensive In-House Services?  : No Does patient have [facility] services? : No Does patient have P4CC services?: No  Past Medical History:  Past Medical History:  Diagnosis Date  . Anxiety   . Bipolar disorder (HCC)   . Homelessness    History reviewed. No pertinent  surgical history. Family History: No family history on file. Family Psychiatric  History: unknown Social History:  History  Alcohol Use No     History  Drug Use No    Social History   Social History  . Marital status: Married    Spouse name: N/A  . Number of children: N/A  . Years of education: N/A   Social History Main Topics  . Smoking status: Current Every Day Smoker    Packs/day: 1.00    Types: Cigarettes  . Smokeless tobacco: Never Used  . Alcohol use No  . Drug use: No  . Sexual activity: Not Asked   Other Topics Concern  . None   Social History Narrative  . None   Additional Social History:    Allergies:  No Known Allergies  Labs: No results found for this or any previous visit (from the past 48 hour(s)).  Current Facility-Administered Medications  Medication Dose Route Frequency Provider Last Rate Last Dose  . carbamazepine (TEGRETOL XR) 12 hr tablet 300 mg  300 mg Oral BID Neysa Hotter, MD      . gabapentin (NEURONTIN) capsule 300 mg  300 mg Oral TID Pricilla Loveless, MD   300 mg at 08/16/16 1515  . hydrOXYzine (ATARAX/VISTARIL) tablet 50 mg  50 mg Oral Once Shaune Pollack, MD      . ibuprofen (ADVIL,MOTRIN) tablet 600 mg  600 mg Oral Q6H PRN Jackelyn Poling, NP   600 mg at 08/16/16 0558  . iloperidone (FANAPT) tablet 2 mg  2 mg Oral BID PRN Neysa Hotter, MD      . iloperidone (FANAPT) tablet 8 mg  8 mg Oral BID Thedore Mins, MD   8 mg at 08/17/16 0936  . LORazepam (ATIVAN) injection 2 mg  2 mg Intramuscular Once Kristeen Mans, NP      . LORazepam (ATIVAN) tablet 2 mg  2 mg Oral Q6H PRN Neysa Hotter, MD   2 mg at 08/16/16 1650   Or  . LORazepam (ATIVAN) injection 2 mg  2 mg Intramuscular Q6H PRN Neysa Hotter, MD      . nicotine (NICODERM CQ - dosed in mg/24 hours) patch 21 mg  21 mg Transdermal Daily Kristeen Mans, NP   21 mg at 08/16/16 1631  . ziprasidone (GEODON) injection 20 mg  20 mg Intramuscular Once Kristeen Mans, NP      . ziprasidone (GEODON)  injection 20 mg  20 mg Intramuscular BID Neysa Hotter, MD       Current Outpatient Prescriptions  Medication Sig Dispense Refill  . ibuprofen (ADVIL,MOTRIN) 800 MG tablet Take 800 mg by mouth every 8 (eight) hours as needed for mild pain or moderate pain.    . Iloperidone (FANAPT) 2 MG TABS Take 2 mg by mouth 2 (two) times daily.      Musculoskeletal: Strength & Muscle Tone: within normal limits Gait & Station: normal Patient leans: N/A  Psychiatric Specialty Exam: Physical Exam  Constitutional: He is oriented to person, place, and time. He appears well-developed and well-nourished.  HENT:  Head: Normocephalic.  Neck: Normal range of motion.  Respiratory: Effort normal.  Musculoskeletal: Normal range of motion.  Neurological: He is alert and oriented to person, place, and time.  Psychiatric: He has a normal mood and affect. His speech is normal and behavior is normal. Judgment and thought content normal. Cognition and memory are normal.    Review of Systems  All other systems reviewed and are negative.   Blood pressure 112/68, pulse 82, temperature 97.6 F (36.4 C), temperature source Oral, resp. rate 16, SpO2 100 %.There is no height or weight on file to calculate BMI.  General Appearance: Casual  Eye Contact:  Good  Speech:  Clear and Coherent  Volume:  Normal  Mood:  Irritable at times  Affect:  Congruent  Thought Process:  Coherent and Descriptions of Associations: Intact  Orientation:  Full (Time, Place, and Person)  Thought Content:  WDL and Logical  Suicidal Thoughts:  No  Homicidal Thoughts:  No  Memory:  Immediate;   Good Recent;   Good Remote;   Good  Judgement:  Fair  Insight:  Good  Psychomotor Activity:  Normal  Concentration:  Concentration: Good and Attention Span: Good  Recall:  Good  Fund of Knowledge:  Good  Language:  Good  Akathisia:  No  Handed:  Right  AIMS (if indicated):     Assets:  Leisure Time Physical Health Resilience Social Support   ADL's:  Intact  Cognition:  WNL  Sleep:        Treatment Plan Summary: Daily contact with patient to assess and evaluate symptoms and progress in treatment, Medication management and Plan bipolar affective disorder, most recent episode mania:  -Crisis stabilization -Medication management:  Continued Fanapat 8 mg BID for mood stabilization and Gabapentin 300 mg TID for anxiety/mood -Individual counseling  Disposition: No evidence of imminent risk to self or others at present.    Nanine Means, NP 08/17/2016 9:40 AM  Patient seen face-to-face for psychiatric evaluation, chart reviewed and case discussed with the physician extender and developed treatment plan. Reviewed the information  documented and agree with the treatment plan. Corena Pilgrim, MD

## 2016-08-18 NOTE — [facility] Suicide Risk Assessment (Signed)
Suicide Risk Assessment  Discharge Assessment   [facility] Discharge Suicide Risk Assessment   Principal Problem: Bipolar disorder, current episode manic without psychotic features, moderate (HCC) Discharge Diagnoses:  Patient Active Problem List   Diagnosis Date Noted  . Bipolar disorder, current episode manic without psychotic features, moderate (HCC) [F31.12] 08/17/2016    Priority: High  . PHARYNGITIS [J02.9] 07/25/2008  . VIRAL URI [J06.9] 07/25/2008    Total Time spent with patient: 30 minutes  Musculoskeletal: Strength & Muscle Tone: within normal limits Gait & Station: normal Patient leans: N/A  Psychiatric Specialty Exam: Physical Exam  Constitutional: He is oriented to person, place, and time. He appears well-developed and well-nourished.  HENT:  Head: Normocephalic.  Neck: Normal range of motion.  Respiratory: Effort normal.  Musculoskeletal: Normal range of motion.  Neurological: He is alert and oriented to person, place, and time.  Psychiatric: He has a normal mood and affect. His speech is normal and behavior is normal. Judgment and thought content normal. Cognition and memory are normal.    Review of Systems  All other systems reviewed and are negative.   Blood pressure 112/68, pulse 82, temperature 97.6 F (36.4 C), temperature source Oral, resp. rate 16, SpO2 100 %.There is no height or weight on file to calculate BMI.  General Appearance: Casual  Eye Contact:  Good  Speech:  Clear and Coherent  Volume:  Normal  Mood:  Irritable at times  Affect:  Congruent  Thought Process:  Coherent and Descriptions of Associations: Intact  Orientation:  Full (Time, Place, and Person)  Thought Content:  WDL and Logical  Suicidal Thoughts:  No  Homicidal Thoughts:  No  Memory:  Immediate;   Good Recent;   Good Remote;   Good  Judgement:  Fair  Insight:  Good  Psychomotor Activity:  Normal  Concentration:  Concentration: Good and Attention Span: Good  Recall:  Good   Fund of Knowledge:  Good  Language:  Good  Akathisia:  No  Handed:  Right  AIMS (if indicated):     Assets:  Leisure Time Physical Health Resilience Social Support  ADL's:  Intact  Cognition:  WNL  Sleep:      Mental Status Per Nursing Assessment::   On Admission:   mania  Demographic Factors:  Male and Caucasian  Loss Factors: NA  Historical Factors: NA  Risk Reduction Factors:   Sense of responsibility to family and Positive social support  Continued Clinical Symptoms:  Irritable at times  Cognitive Features That Contribute To Risk:  None    Suicide Risk:  Minimal: No identifiable suicidal ideation.  Patients presenting with no risk factors but with morbid ruminations; may be classified as minimal risk based on the severity of the depressive symptoms    Plan Of Care/Follow-up recommendations:  Activity:  as tolerated Diet:  heart healthy diet  Ibn Stief, NP 08/18/2016, 11:10 AM

## 2018-05-18 IMAGING — CT CT HEAD W/O CM
3 of 4 series · 14 of 47 positions shown, 16 images · non-contrast
Comparison: None.

CLINICAL DATA: Right ear laceration.

EXAM:
CT HEAD WITHOUT CONTRAST
TECHNIQUE: Contiguous axial images were obtained from the base of the skull
through the vertex without intravenous contrast.

[Series 2: head w/o · axial · non-contrast · 0.46mm/px · z∈[-105,+20]mm · 8 of 31 slices shown, 10 images]
[im 3/31  brain]
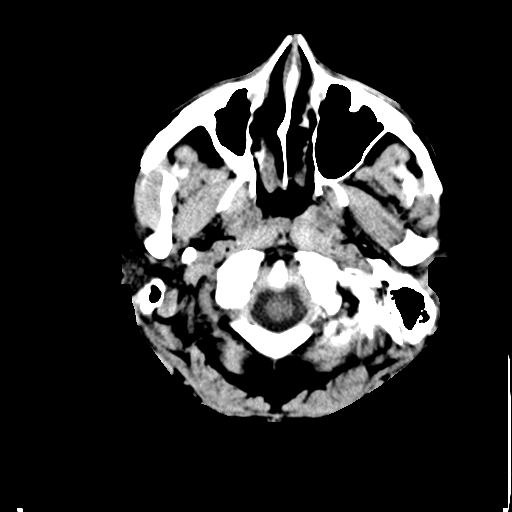
[im 3/31  bone]
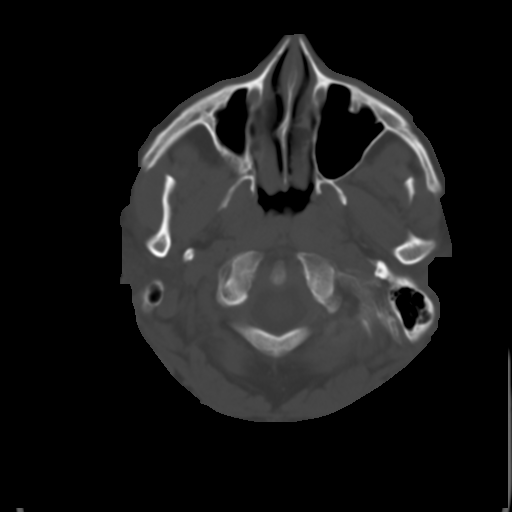
[im 7/31  brain]
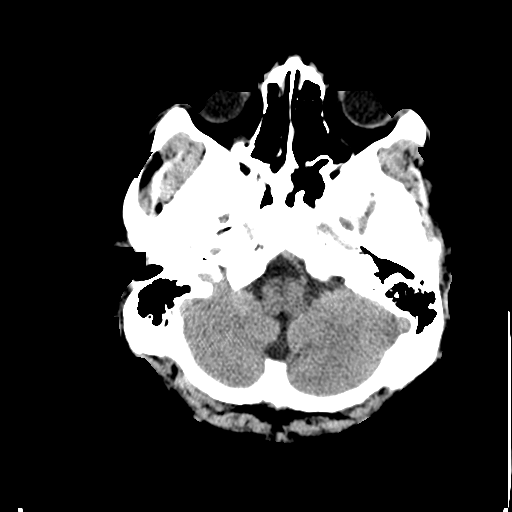
[im 11/31  brain]
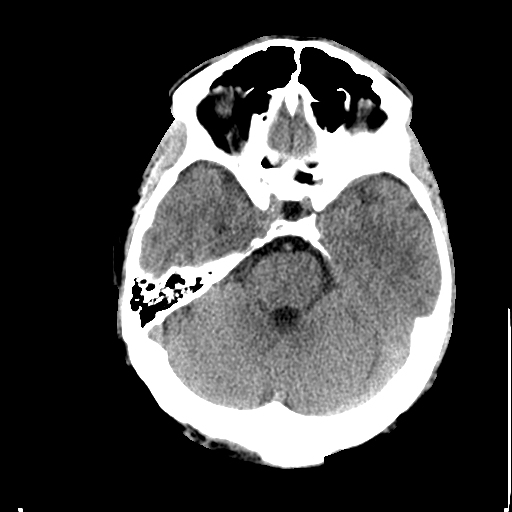
[im 13/31  brain]
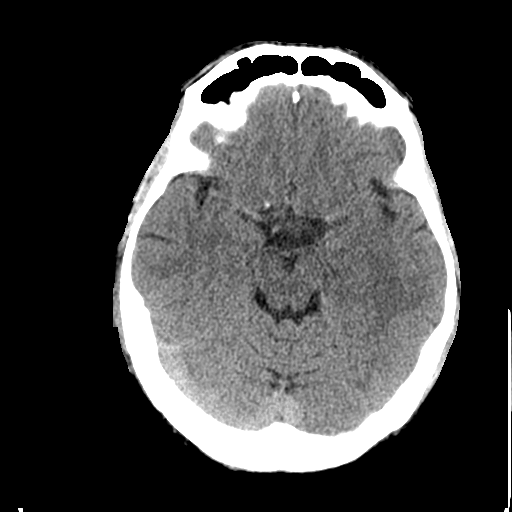
[im 18/31  brain]
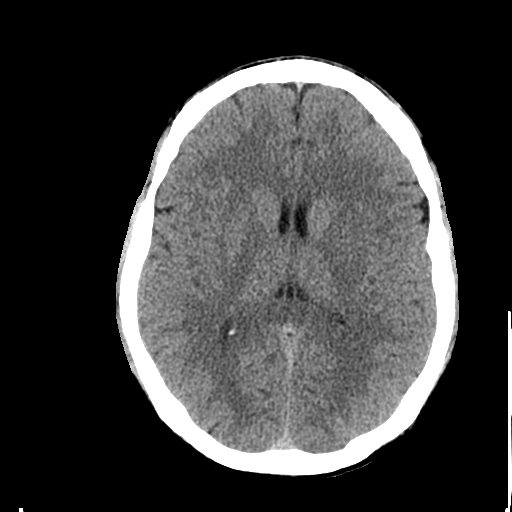
[im 18/31  bone]
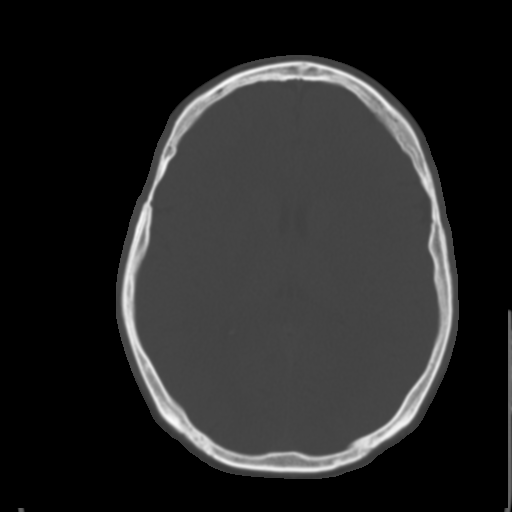
[im 20/31  brain]
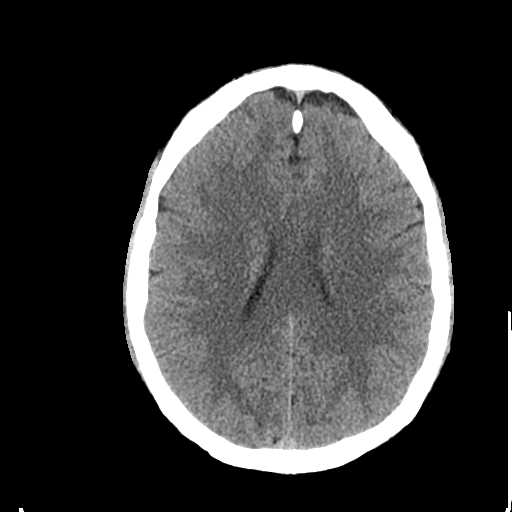
[im 24/31  brain]
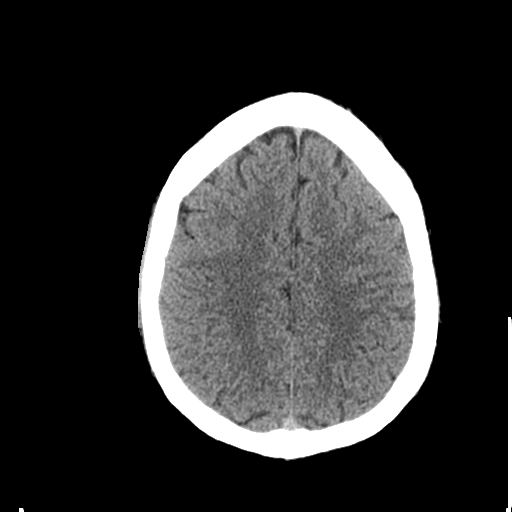
[im 28/31  brain]
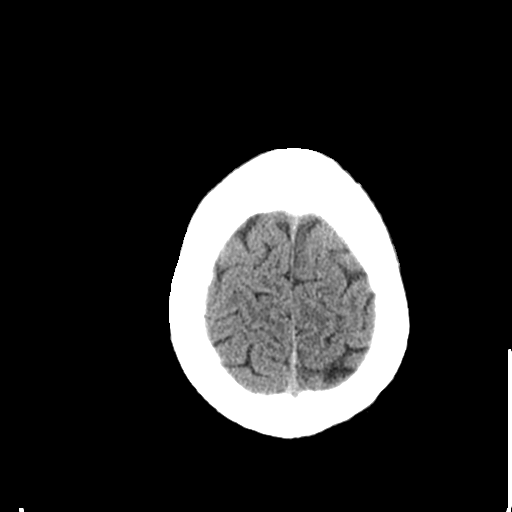

[Series 4: coronal · coronal · 0.30mm/px · 3 of 77 slices shown]
[im 26/77  brain]
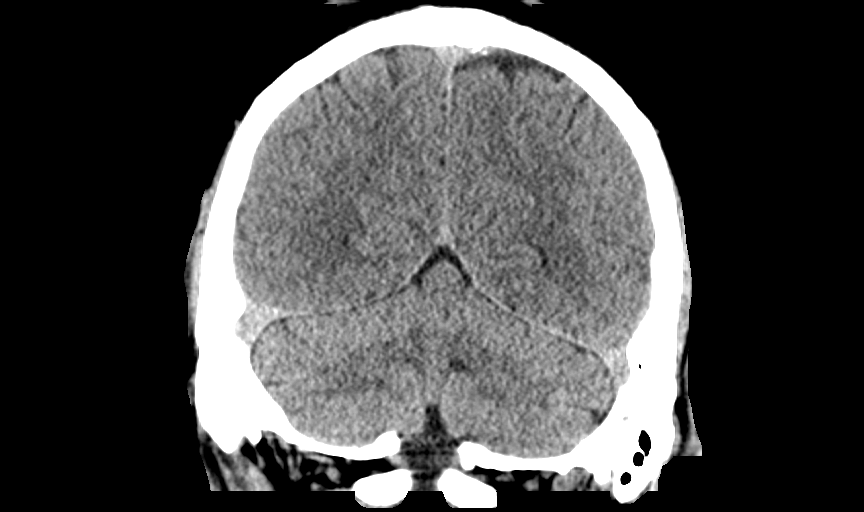
[im 34/77  brain]
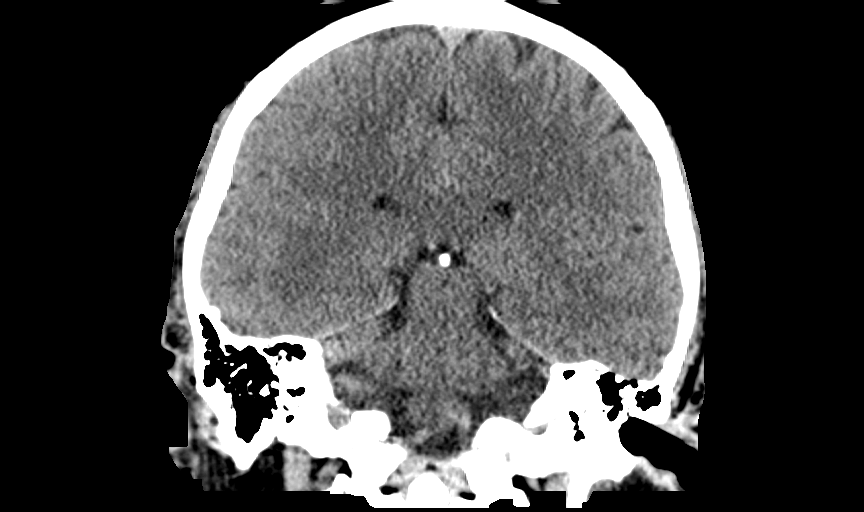
[im 43/77  brain]
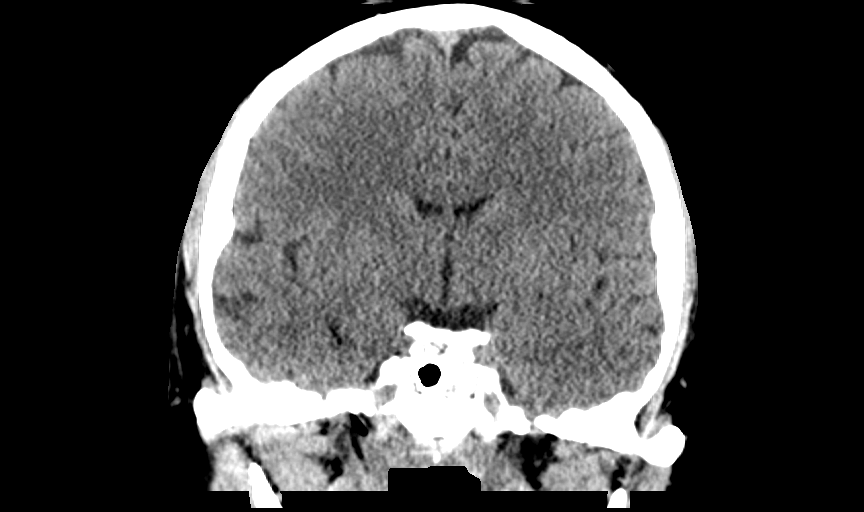

[Series 5: sagittal · sagittal · 0.30mm/px · 3 of 77 slices shown]
[im 26/77  brain]
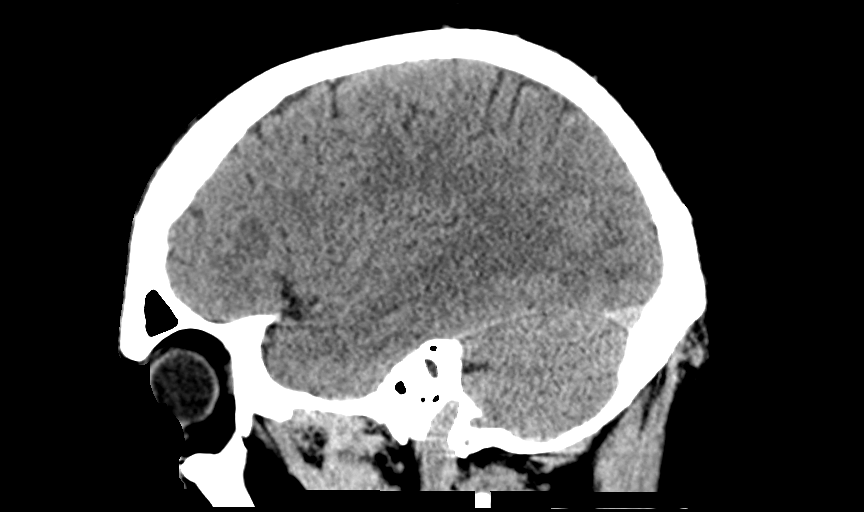
[im 39/77  brain]
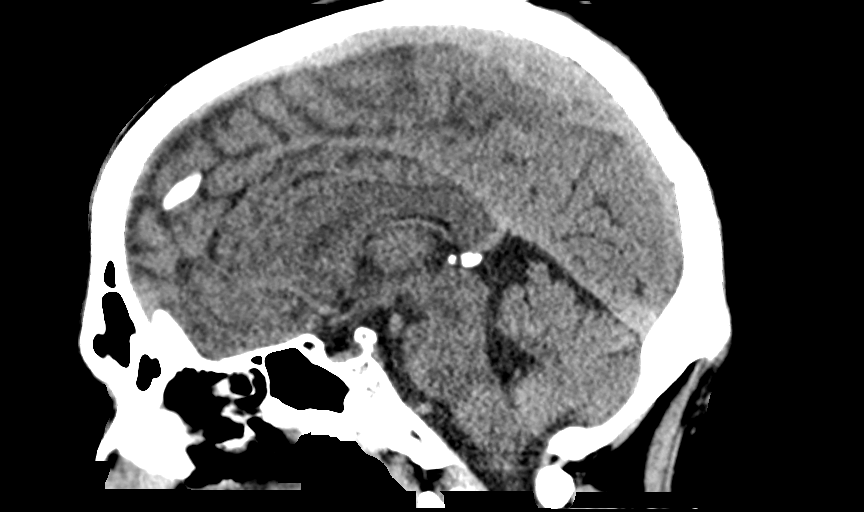
[im 51/77  brain]
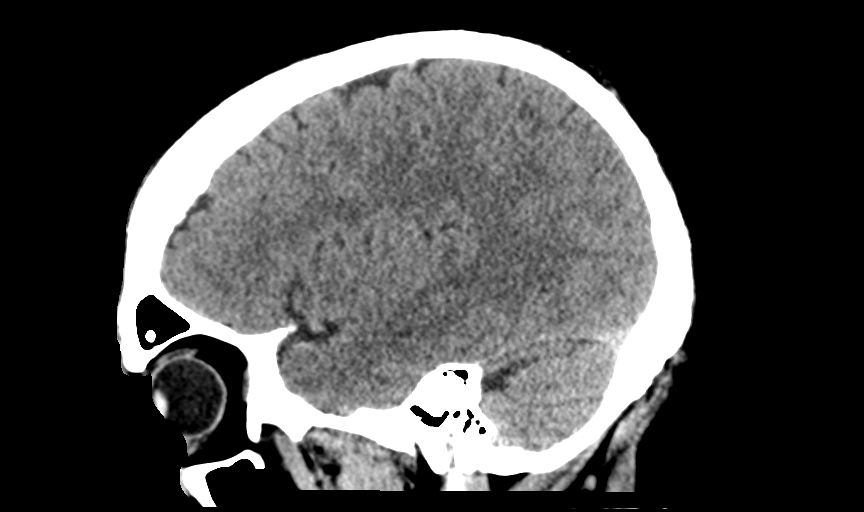

[14 of 47 positions shown; findings below may reference images not displayed]

FINDINGS: Brain: No evidence of acute infarction, hemorrhage, hydrocephalus,
extra-axial collection or mass lesion/mass effect.

Vascular: No hyperdense vessel or unexpected calcification.

Skull: Normal. Negative for fracture or focal lesion.

Sinuses/Orbits: No acute finding.

Other: None.
IMPRESSION: No acute intracranial abnormality.
